# Patient Record
Sex: Male | Born: 1955
Health system: Southern US, Community
[De-identification: ages and names within clinical notes are randomized; demographics above are authoritative.]

## PROBLEM LIST (undated history)

## (undated) DIAGNOSIS — M722 Plantar fascial fibromatosis: Secondary | ICD-10-CM

## (undated) DIAGNOSIS — G4733 Obstructive sleep apnea (adult) (pediatric): Secondary | ICD-10-CM

## (undated) DIAGNOSIS — K859 Acute pancreatitis without necrosis or infection, unspecified: Secondary | ICD-10-CM

## (undated) DIAGNOSIS — K5790 Diverticulosis of intestine, part unspecified, without perforation or abscess without bleeding: Secondary | ICD-10-CM

## (undated) DIAGNOSIS — I10 Essential (primary) hypertension: Secondary | ICD-10-CM

## (undated) DIAGNOSIS — Z9989 Dependence on other enabling machines and devices: Secondary | ICD-10-CM

## (undated) DIAGNOSIS — Z9889 Other specified postprocedural states: Secondary | ICD-10-CM

## (undated) HISTORY — DX: Diverticulosis of intestine, part unspecified, without perforation or abscess without bleeding: K57.90

## (undated) HISTORY — DX: Plantar fascial fibromatosis: M72.2

## (undated) HISTORY — DX: Other specified postprocedural states: Z98.890

## (undated) HISTORY — DX: Acute pancreatitis without necrosis or infection, unspecified: K85.90

## (undated) HISTORY — DX: Essential (primary) hypertension: I10

---

## 1991-08-24 HISTORY — PX: LUMBAR DISC SURGERY: SHX700

## 2011-08-24 DIAGNOSIS — K859 Acute pancreatitis without necrosis or infection, unspecified: Secondary | ICD-10-CM

## 2011-08-24 HISTORY — DX: Acute pancreatitis without necrosis or infection, unspecified: K85.90

## 2011-08-24 HISTORY — PX: KNEE ARTHROSCOPY: SHX127

## 2014-10-29 DIAGNOSIS — G4700 Insomnia, unspecified: Secondary | ICD-10-CM | POA: Insufficient documentation

## 2014-10-29 DIAGNOSIS — G4730 Sleep apnea, unspecified: Secondary | ICD-10-CM | POA: Insufficient documentation

## 2014-10-29 DIAGNOSIS — I10 Essential (primary) hypertension: Secondary | ICD-10-CM | POA: Insufficient documentation

## 2015-09-10 DIAGNOSIS — M7550 Bursitis of unspecified shoulder: Secondary | ICD-10-CM | POA: Insufficient documentation

## 2016-12-09 ENCOUNTER — Encounter: Payer: Self-pay | Admitting: Podiatry

## 2016-12-09 ENCOUNTER — Ambulatory Visit (INDEPENDENT_AMBULATORY_CARE_PROVIDER_SITE_OTHER): Payer: BLUE CROSS/BLUE SHIELD

## 2016-12-09 ENCOUNTER — Ambulatory Visit (INDEPENDENT_AMBULATORY_CARE_PROVIDER_SITE_OTHER): Payer: BLUE CROSS/BLUE SHIELD | Admitting: Podiatry

## 2016-12-09 VITALS — BP 127/76 | HR 71 | Resp 16 | Ht 70.0 in | Wt 280.0 lb

## 2016-12-09 DIAGNOSIS — M722 Plantar fascial fibromatosis: Secondary | ICD-10-CM | POA: Diagnosis not present

## 2016-12-09 MED ORDER — TRIAMCINOLONE ACETONIDE 10 MG/ML IJ SUSP
10.0000 mg | Freq: Once | INTRAMUSCULAR | Status: AC
  Administered 2016-12-09: 10 mg

## 2016-12-09 NOTE — Patient Instructions (Signed)

## 2016-12-09 NOTE — Progress Notes (Signed)
Subjective:     Patient ID: Louis Watson, male   DOB: July 18, 1956, 61 y.o.   MRN: [id number]  HPI patient presents with an approximate 4 year history of heel pain left and is had orthotics over 82 years old that are no longer providing function and is had them worked on several times. Patient states the pain is been intense for approximately the last 3 months   Review of Systems  All other systems reviewed and are negative.      Objective:   Physical Exam  Constitutional: He is oriented to person, place, and time.  Cardiovascular: Intact distal pulses.   Musculoskeletal: Normal range of motion.  Neurological: He is oriented to person, place, and time.  Skin: Skin is warm.  Nursing note and vitals reviewed.  neurovascular status found to be intact with muscle strength adequate range of motion within normal limits with patient noted to have exquisite discomfort medial fascial band left heel at the insertional point tendon the calcaneus with moderate depression of the arch noted     Assessment:     Acute plantar fasciitis left with structural changes and arch changes consistent with long-term chronic plantar fasciitis    Plan:     H&P x-ray reviewed and today I injected the plantar fascial left 3 mg Kenalog 5 mill grams Xylocaine and applied fascial brace with instructions on usage along with instructions on physical therapy. I do think he'll need new orthotics and the existing pair can be rehabilitated and I have scheduled him with pad or just in 2 weeks to discuss a new orthotic which will be slightly softer with softer materials to wear and to have this pair redone. Patient will also see me in 2 weeks  X-ray report indicate spur formation both plantar and posterior left heel with depression of the arch noted

## 2016-12-09 NOTE — Progress Notes (Signed)
   Subjective:    Patient ID: Louis Watson, male    DOB: 20-Jan-1956, 61 y.o.   MRN: [id number]  HPI Chief Complaint  Patient presents with  . Foot Pain    Left foot; bottom of heel; pt stated, "Pain radiates up the foot; had flare-up 2 months ago"      Review of Systems  Musculoskeletal: Positive for gait problem.  Skin: Positive for rash.  All other systems reviewed and are negative.      Objective:   Physical Exam        Assessment & Plan:

## 2016-12-29 ENCOUNTER — Ambulatory Visit: Payer: BLUE CROSS/BLUE SHIELD | Admitting: *Deleted

## 2016-12-29 ENCOUNTER — Ambulatory Visit (INDEPENDENT_AMBULATORY_CARE_PROVIDER_SITE_OTHER): Payer: BLUE CROSS/BLUE SHIELD | Admitting: Podiatry

## 2016-12-29 DIAGNOSIS — M722 Plantar fascial fibromatosis: Secondary | ICD-10-CM

## 2016-12-29 NOTE — Progress Notes (Signed)
Patient presents today for fitting of CMFO...patient has cavus foot type and experiencing facia pain at insertion into navicular tuberosity.   FO recommended are semi-rigid shell w/ 4* valgus ff post. 1/8" PPT padding and EVA cover..same weighs 280 # and wears a size 10.5 shoe....  Patient will get current FO recovered after he receives in 3 weeks..  [id number] x 2

## 2016-12-29 NOTE — Progress Notes (Signed)
P 

## 2017-01-10 ENCOUNTER — Encounter: Payer: Self-pay | Admitting: *Deleted

## 2017-01-10 ENCOUNTER — Telehealth: Payer: Self-pay | Admitting: *Deleted

## 2017-01-10 NOTE — Telephone Encounter (Signed)
PreVisit Call completed. [place] [facility] was [facility]. Pt states that 1 year ago a cyst was found on one of his kidneys through an ultrasound. Pt does not believe follow up was required.  Pt also states that he has been experiencing right shoulder pain for 1.5 years after trying to swat a grasshopper. States it mostly hurts when he tries to reach for something or extend his arm in a certain way. He also cannot sleep on it without pain. Pt also being treated for plantar fascitis and gets new orthodics soon.  Pt states last colon[facility] was 3 months ago [facility]. [place] will also have last TDAP.

## 2017-01-11 ENCOUNTER — Ambulatory Visit (INDEPENDENT_AMBULATORY_CARE_PROVIDER_SITE_OTHER): Payer: BLUE CROSS/BLUE SHIELD | Admitting: Family Medicine

## 2017-01-11 ENCOUNTER — Ambulatory Visit (INDEPENDENT_AMBULATORY_CARE_PROVIDER_SITE_OTHER): Payer: BLUE CROSS/BLUE SHIELD

## 2017-01-11 ENCOUNTER — Encounter: Payer: Self-pay | Admitting: Family Medicine

## 2017-01-11 VITALS — BP 114/76 | HR 60 | Temp 97.9°F | Ht 70.0 in | Wt 274.4 lb

## 2017-01-11 DIAGNOSIS — G8929 Other chronic pain: Secondary | ICD-10-CM | POA: Diagnosis not present

## 2017-01-11 DIAGNOSIS — K5790 Diverticulosis of intestine, part unspecified, without perforation or abscess without bleeding: Secondary | ICD-10-CM | POA: Diagnosis not present

## 2017-01-11 DIAGNOSIS — G4700 Insomnia, unspecified: Secondary | ICD-10-CM | POA: Diagnosis not present

## 2017-01-11 DIAGNOSIS — M25511 Pain in right shoulder: Secondary | ICD-10-CM

## 2017-01-11 DIAGNOSIS — N529 Male erectile dysfunction, unspecified: Secondary | ICD-10-CM

## 2017-01-11 DIAGNOSIS — Z8719 Personal history of other diseases of the digestive system: Secondary | ICD-10-CM

## 2017-01-11 DIAGNOSIS — I10 Essential (primary) hypertension: Secondary | ICD-10-CM

## 2017-01-11 DIAGNOSIS — Z9889 Other specified postprocedural states: Secondary | ICD-10-CM

## 2017-01-11 MED ORDER — SILDENAFIL CITRATE 20 MG PO TABS: ORAL_TABLET | ORAL | 0 refills | Status: DC

## 2017-01-11 MED ORDER — AMLODIPINE BESYLATE 5 MG PO TABS: 5.0000 mg | ORAL_TABLET | Freq: Every day | ORAL | 3 refills | Status: DC

## 2017-01-11 MED ORDER — ESZOPICLONE 2 MG PO TABS: 2.0000 mg | ORAL_TABLET | Freq: Every evening | ORAL | 3 refills | Status: DC | PRN

## 2017-01-11 NOTE — Progress Notes (Signed)
Louis Watson is a 61 y.o. male [facility]   Patient Care Team: Louis Watson, Louis Gruen, DO as PCP - General (Family Medicine)   History of Present Illness:   Louis Watson CMA acting as scribe for Louis Watson.  HPI:  1. Shoulder Pain. Patient complains of right shoulder pain. The symptoms began several years ago. Aggravating factors: injury happened when he reached back while arm was abducted. Pain is located in the anterior glenohumeral region. Discomfort is described as aching and sharp/stabbing. Symptoms are exacerbated by repetitive movements, overhead movements and lying on the shoulder. Evaluation to date: none. Therapy to date includes: nothing specific.   2. Erectile dysfunction. Patient has been on Viagra prn for over a year now. Tolerates medications well, no side effects. Asks for a refill today.   3. Hypertension.  Home blood pressure readings not taken.  Avoiding excessive salt intake? [x]   YES  []   NO Trying to exercise on a regular basis? [x]   YES  []   NO Review: taking medications as instructed, no medication side effects noted, no TIAs, no chest pain on exertion, no dyspnea on exertion, no swelling of ankles.   Wt Readings from Last 3 Encounters:  01/11/17 274 lb 6.4 oz (124.5 kg)  12/09/16 280 lb (127 kg)   Reports that he has been smoking Cigars.  He has never used smokeless tobacco.  BP Readings from Last 3 Encounters:  01/11/17 114/76  12/09/16 127/76    4. Insomnia. Patient complains of frequent night time awakening and difficulty falling asleep for the past 1 year.  Associated symptoms include daytime somnolence. The patient has been taking: Lunesta. Side effects from the medication: none.    Health Maintenance Due  Topic Date Due  . Hepatitis C Screening  06/15/1956  . HIV Screening  04/14/1970  . TETANUS/TDAP  04/14/1974   PMHx, SurgHx, SocialHx, Medications, and Allergies were reviewed in the Visit Navigator and updated as appropriate.    Past Medical History:  Diagnosis Date  . Hypertension   . Pancreatitis 2013  . Plantar fasciitis     Past Surgical History:  Procedure Laterality Date  . KNEE ARTHROSCOPY  2013  . LUMBAR DISC SURGERY  1993    Family History  Problem Relation Age of Onset  . Congestive Heart Failure Mother    Social History  Substance Use Topics  . Smoking status: Current Some Day Smoker    Types: Cigars  . Smokeless tobacco: Never Used  . Alcohol use No    Current Medications and Allergies:   Current Outpatient Prescriptions:  .  amLODipine (NORVASC) 5 MG tablet, Take by mouth daily. , Disp: , Rfl:  .  aspirin EC 81 MG tablet, Take 81 mg by mouth daily., Disp: , Rfl:  .  eszopiclone (LUNESTA) 2 MG TABS tablet, Take 2 mg by mouth at bedtime as needed for sleep. Take immediately before bedtime, Disp: , Rfl:  .  Sildenafil Citrate (VIAGRA PO), Take by mouth as needed., Disp: , Rfl:   Allergies  Allergen Reactions  . Penicillins Rash   Review of Systems:   Review of Systems  Constitutional: Negative for chills, fever and malaise/fatigue.  HENT: Negative for ear pain, sinus pain and sore throat.   Eyes: Negative for blurred vision and double vision.  Respiratory: Negative for cough, shortness of breath and wheezing.   Cardiovascular: Negative for chest pain, palpitations and leg swelling.  Gastrointestinal: Negative for diarrhea, nausea and vomiting.  Musculoskeletal: Negative  for back pain, joint pain and neck pain.  Neurological: Negative for dizziness and headaches.  Psychiatric/Behavioral: Negative for depression, hallucinations and memory loss.   Vitals:   Vitals:   01/11/17 1100  BP: 114/76  Pulse: 60  Temp: 97.9 F (36.6 C)  TempSrc: Oral  SpO2: 97%  Weight: 274 lb 6.4 oz (124.5 kg)  Height: 5\' 10"  (1.778 m)     Body mass index is 39.37 kg/m.  Physical Exam:   Physical Exam  Constitutional: He is oriented to person, place, and time. He appears well-developed  and well-nourished. No distress.  HENT:  Head: Normocephalic and atraumatic.  Right Ear: External ear normal.  Left Ear: External ear normal.  Nose: Nose normal.  Mouth/Throat: Oropharynx is clear and moist.  Eyes: Conjunctivae and EOM are normal. Pupils are equal, round, and reactive to light.  Neck: Normal range of motion. Neck supple.  Cardiovascular: Normal rate, regular rhythm, normal heart sounds and intact distal pulses.   Pulmonary/Chest: Effort normal and breath sounds normal.  Abdominal: Soft. Bowel sounds are normal.  Musculoskeletal:       Right shoulder: He exhibits decreased range of motion, tenderness and decreased strength. He exhibits no bony tenderness, no swelling, no effusion, no crepitus, no deformity, no spasm and normal pulse.  Neurological: He is alert and oriented to person, place, and time.  Skin: Skin is warm and dry.  Psychiatric: He has a normal mood and affect. His behavior is normal. Judgment and thought content normal.  Nursing note and vitals reviewed.   Assessment and Plan:   [name] was seen today for establish care and shoulder pain.  Diagnoses and all orders for this visit:  Chronic right shoulder pain Comments: New. Imaging today reveals arthritis. To [name] for further evaluation. Orders: -     DG Shoulder Right  Erectile dysfunction, unspecified erectile dysfunction type Comments: Well controlled.  No signs of complications, medication side effects, or red flags.  Continue current regimen. Refill of Viagra today. Orders: -     sildenafil (REVATIO) 20 MG tablet; 5 po x 1, take 0.5 to 4 hours prior to sexual activity  Essential hypertension Comments: Well controlled.  No signs of complications, medication side effects, or red flags.  Continue current regimen. Refill of medications today. Orders: -     amLODipine (NORVASC) 5 MG tablet; Take 1 tablet (5 mg total) by mouth daily.  Insomnia, unspecified type Comments: Well controlled.  No  signs of complications, medication side effects, or red flags.  Continue current regimen. Refill of medications today, Orders: -     eszopiclone (LUNESTA) 2 MG TABS tablet; Take 1 tablet (2 mg total) by mouth at bedtime as needed for sleep. Take immediately before bedtime  Records requested if needed. Time spent with the patient: 30  minutes, of which >50% was spent in obtaining information about his symptoms, reviewing his previous labs, evaluations, and treatments, counseling him about his condition (please see the discussed topics above), and developing a plan to further investigate it; he had a number of questions which I addressed.  We reviewed HM issues, reviewed my guidelines re: controlled substances. I went over therapeutic exercises to try until his SM evaluation.  . Reviewed expectations re: course of current medical issues. . Discussed self-management of symptoms. . Outlined signs and symptoms indicating need for more acute intervention. . Patient verbalized understanding and all questions were answered. Marland Kitchen Health Maintenance issues including appropriate healthy diet, exercise, and smoking avoidance were discussed with  patient. . See orders for this visit as documented in the electronic medical record. . Patient received an After Visit Summary.  CMA served as Neurosurgeon during this visit. History, Physical, and Plan performed by medical provider. The above documentation has been reviewed and is accurate and complete. Louis Rima, D.O.  Louis Rima, DO [place], [place] 01/15/2017  Future Appointments Date Time Provider Department Watson  01/19/2017 4:00 PM Andrena Mews, DO [facility] None  01/20/2017 3:30 PM [facility]  07/07/2017 3:45 PM Louis Rima, DO [facility] None

## 2017-01-15 DIAGNOSIS — M25511 Pain in right shoulder: Principal | ICD-10-CM

## 2017-01-15 DIAGNOSIS — G8929 Other chronic pain: Secondary | ICD-10-CM | POA: Insufficient documentation

## 2017-01-15 DIAGNOSIS — N529 Male erectile dysfunction, unspecified: Secondary | ICD-10-CM | POA: Insufficient documentation

## 2017-01-17 ENCOUNTER — Encounter: Payer: Self-pay | Admitting: Family Medicine

## 2017-01-17 DIAGNOSIS — Z9889 Other specified postprocedural states: Secondary | ICD-10-CM | POA: Insufficient documentation

## 2017-01-17 DIAGNOSIS — K5790 Diverticulosis of intestine, part unspecified, without perforation or abscess without bleeding: Secondary | ICD-10-CM | POA: Insufficient documentation

## 2017-01-17 DIAGNOSIS — Z8719 Personal history of other diseases of the digestive system: Secondary | ICD-10-CM | POA: Insufficient documentation

## 2017-01-17 HISTORY — DX: Other specified postprocedural states: Z98.890

## 2017-01-17 HISTORY — DX: Diverticulosis of intestine, part unspecified, without perforation or abscess without bleeding: K57.90

## 2017-01-19 ENCOUNTER — Ambulatory Visit (INDEPENDENT_AMBULATORY_CARE_PROVIDER_SITE_OTHER): Payer: BLUE CROSS/BLUE SHIELD | Admitting: Sports Medicine

## 2017-01-19 ENCOUNTER — Ambulatory Visit: Payer: Self-pay

## 2017-01-19 ENCOUNTER — Encounter: Payer: Self-pay | Admitting: Sports Medicine

## 2017-01-19 VITALS — BP 122/82 | HR 65 | Ht 70.0 in | Wt 277.2 lb

## 2017-01-19 DIAGNOSIS — S4381XA Sprain of other specified parts of right shoulder girdle, initial encounter: Secondary | ICD-10-CM

## 2017-01-19 DIAGNOSIS — M25511 Pain in right shoulder: Secondary | ICD-10-CM | POA: Diagnosis not present

## 2017-01-19 DIAGNOSIS — G8929 Other chronic pain: Secondary | ICD-10-CM

## 2017-01-19 DIAGNOSIS — S46811A Strain of other muscles, fascia and tendons at shoulder and upper arm level, right arm, initial encounter: Secondary | ICD-10-CM

## 2017-01-19 MED ORDER — NITROGLYCERIN 0.2 MG/HR TD PT24: MEDICATED_PATCH | TRANSDERMAL | 1 refills | Status: DC

## 2017-01-19 NOTE — Progress Notes (Signed)
OFFICE VISIT NOTE Louis Watson. Louis Watson Sports [facility] [phone]  Louis Watson - 61 y.o. male MRN [id number]  Date of birth: 1956/08/02  Visit Date: 01/19/2017  PCP: Helane Rima, DO   Referred by: Helane Rima, DO  Orlie Dakin, CMA acting as scribe for Dr. [name].  SUBJECTIVE:   Chief Complaint  Patient presents with  . pain in right shoulder   HPI: As below and per problem based documentation when appropriate.  Pt presents today with right shoulder pain, anterior glenohumeral region.  Pain has been present for the past year and a half but has gotten worse over the past several months.  Pain started while pt was sitting with his arms on the railing when he swung his arms backward while the arm was abducted.   The pain is described as constant dull aching pain with an occasional sharp stabbing pain with certain movements. Pain is rated as 1/10 on average but 10/10 with certain movements.  Worsened with repetitive movements, overhead movements, and lying on the right shoulder.  Improves with resting the arm but pain never completely resolves Therapies tried include : Pt has tried Ibuprofen with some relief, he uses this mostly for his plantar fasciitis. He has also tried home exercises but doesn't do them often because the sharp pain doesn't occur often.   Other associated symptoms include: pain radiates down the right arm but doesn't go below the elbow.   Pt had xray done 01/11/17  Pt denies fever, chills, night sweats, unintentional weight loss or weight gain.    Review of Systems  Constitutional: Negative for chills and fever.  Respiratory: Negative for shortness of breath and wheezing.   Cardiovascular: Negative for chest pain, palpitations and leg swelling.  Musculoskeletal: Positive for joint pain. Negative for falls.  Neurological: Negative for dizziness, tingling and headaches.  Endo/Heme/Allergies: Does not  bruise/bleed easily.    Otherwise per HPI.  HISTORY & PERTINENT PRIOR DATA:  No specialty comments available. He reports that he has been smoking Cigars.  He has never used smokeless tobacco. No results for input(s): HGBA1C, LABURIC in the last 8760 hours. Medications & Allergies reviewed per EMR Patient Active Problem List   Diagnosis Date Noted  . Partial tear of right subscapularis tendon 01/25/2017  . Hx of pancreatitis 01/17/2017  . Hx of lumbar discectomy 01/17/2017  . Diverticulosis 01/17/2017  . Erectile dysfunction 01/15/2017  . Chronic right shoulder pain 01/15/2017  . Bursitis of shoulder 09/10/2015  . Hypertension 10/29/2014  . Insomnia 10/29/2014  . Sleep apnea 10/29/2014   Past Medical History:  Diagnosis Date  . Diverticulosis 01/17/2017  . Hx of lumbar discectomy 01/17/2017  . Hx of pancreatitis 01/17/2017  . Hypertension   . Pancreatitis 2013  . Plantar fasciitis    Family History  Problem Relation Age of Onset  . Congestive Heart Failure Mother    Past Surgical History:  Procedure Laterality Date  . KNEE ARTHROSCOPY  2013  . LUMBAR DISC SURGERY  1993   Social History   Occupational History  . Not on file.   Social History Main Topics  . Smoking status: Current Some Day Smoker    Types: Cigars  . Smokeless tobacco: Never Used  . Alcohol use No  . Drug use: No  . Sexual activity: Not on file    OBJECTIVE:  VS:  HT:5\' 10"  (177.8 cm)   WT:277 lb 3.2 oz (125.7 kg)  BMI:39.9  BP:122/82  HR:65bpm  TEMP: ( )  RESP:95 % EXAM: Findings:  WDWN, NAD, Non-toxic appearing Alert & appropriately interactive Not depressed or anxious appearing No increased work of breathing. Pupils are equal. EOM intact without nystagmus No clubbing or cyanosis of the extremities appreciated No significant rashes/lesions/ulcerations overlying the examined area. Radial pulses 2+/4.  No significant generalized UE edema. Sensation intact to light touch in upper  extremities.  Right Shoulder Exam: Normal alignment, Normal Contours No overlying erythema/ecchymosis. No pain or. Crepitation with axial loading and circumduction TTP over: Anterior deltopectoral groove as well as bicipital groove. No TTP over: AC joint, clavicle or posterior shoulder Internal Rotation: Range of motion is normal however marked pain with resisted manual muscle testing. External Rotation: Limited by 25 Empty can: Mildly painful strength is intact Hawkins: Mildly painful Neers: Painful Speeds: Only minimal pain O'Brien's: Only mild discomfort   ++++++++++++++++++++++++++++++++++++++++++++++++++++++++++++++++++ LIMITED MSK ULTRASOUND OF right shoulder Images were obtained and interpreted by myself, Gaspar Bidding, DO  Images have been saved and stored to PACS system. Images obtained on: GE S7 Ultrasound machine  FINDINGS:  Biceps Tendon: Slight subluxation of the bicipital groove but the tendon is intact. Pec Major Insertion: Normal Subscapularis Tendon: There is marked thickening and hypoechoic change within the superior fibers with atrophy of the muscle.  Unable to appreciate any full-thickness component Supraspinatus Tendon: Appears to be intact without significant subacromial impingement Infraspinatus/Teres Minor Tendon: Normal AC Joint: Small ultrasound that is nonpainful to sono palpation JOINT: Only minimal degenerative change of the glenohumeral joint LABRUM: Could not appreciate on exam  IMPRESSION:  Subscapularis tendon interstitial tear without ultrasound evidence of full-thickness involvement     Dg Shoulder Right  Result Date: 01/11/2017 CLINICAL DATA:  Right shoulder pain.  No injury. EXAM: RIGHT SHOULDER - 2+ VIEW COMPARISON:  None. FINDINGS: Acromioclavicular glenohumeral degenerative change. No evidence of fracture dislocation. Small soft tissue calcification noted over the proximal humerus of questionable etiology, most likely benign  dystrophic calcification. IMPRESSION: Acromioclavicular and glenohumeral degenerative change. No acute abnormality. Electronically Signed   By: Maisie Fus  Register   On: 01/11/2017 15:36   ASSESSMENT & PLAN:   Problem List Items Addressed This Visit    Chronic right shoulder pain - Primary    The [facility] joint arthropathy does not seem to be contributing to his pain as he has no focal tenderness over the [facility] joint.  This can be done in the future if he wishes.       Relevant Orders   [place] LIMITED JOINT SPACE STRUCTURES UP RIGHT(NO LINKED CHARGES)   Partial tear of right subscapularis tendon    Does have findings suggesting a subscapularis tendon tear superior fibers with likely biceps subluxation. We discussed multiple options for this including conservative measures with scapular stabilizing exercises rotator cuff strengthening especially focusing on eccentric phase and biologic therapy with nitroglycerin.  Patient would like to pursue this route and we will plan to reevaluate him in 6 weeks.    +++++++++++++++++++++++++++++++++++++++++++++++++++++++++++++++ PROCEDURE NOTE: THERAPEUTIC EXERCISES (97110) 15 minutes spent for Therapeutic exercises as stated in above notes.  This included exercises focusing on stretching, strengthening, with significant focus on eccentric aspects.   Proper technique shown and discussed handout in great detail with ATC.  All questions were discussed and answered.        Relevant Medications   nitroGLYCERIN (NITRODUR - DOSED IN MG/24 HR) 0.2 mg/hr patch      Follow-up: Return in about 6 weeks (around 03/02/2017).   CMA/ATC  served as Neurosurgeonscribe during this visit. History, Physical, and Plan performed by medical provider. Documentation and orders reviewed and attested to.      Gaspar BiddingMichael Rigby, DO    [facility] Physician

## 2017-01-19 NOTE — Patient Instructions (Addendum)
Please perform the exercise program that Fayrene FearingJames has prepared for you and gone over in detail on a daily basis.  In addition to the handout you were provided you can access your program through: [email or web address]   Your unique program code is: [email or web address]   Nitroglycerin Protocol   Apply 1/4 nitroglycerin patch to affected area daily.  Change position of patch within the affected area every 24 hours.  You may experience a headache during the first 1-2 weeks of using the patch, these should subside.  If you experience headaches after beginning nitroglycerin patch treatment, you may take your preferred over the counter pain reliever.  Another side effect of the nitroglycerin patch is skin irritation or rash related to patch adhesive.  Please notify our office if you develop more severe headaches or rash, and stop the patch.  Tendon healing with nitroglycerin patch may require 12 to 24 weeks depending on the extent of injury.  Men should not use if taking Viagra, Cialis, or Levitra.   Do not use if you have migraines or rosacea.

## 2017-01-20 ENCOUNTER — Other Ambulatory Visit: Payer: BLUE CROSS/BLUE SHIELD

## 2017-01-20 DIAGNOSIS — M722 Plantar fascial fibromatosis: Secondary | ICD-10-CM

## 2017-01-25 DIAGNOSIS — S46811A Strain of other muscles, fascia and tendons at shoulder and upper arm level, right arm, initial encounter: Secondary | ICD-10-CM | POA: Insufficient documentation

## 2017-01-25 DIAGNOSIS — S4381XA Sprain of other specified parts of right shoulder girdle, initial encounter: Secondary | ICD-10-CM | POA: Insufficient documentation

## 2017-01-25 NOTE — Assessment & Plan Note (Signed)
[facility] does not seem to be contributing to his pain as he has no focal tenderness [facility].  This can be done in the future if he wishes.

## 2017-01-25 NOTE — Assessment & Plan Note (Signed)
Does have findings suggesting a subscapularis tendon tear superior fibers with likely biceps subluxation. We discussed multiple options for this including conservative measures with scapular stabilizing exercises rotator cuff strengthening especially focusing on eccentric phase and biologic therapy with nitroglycerin.  Patient would like to pursue this route and we will plan to reevaluate him in 6 weeks.    +++++++++++++++++++++++++++++++++++++++++++++++++++++++++++++++ PROCEDURE NOTE: THERAPEUTIC EXERCISES (97110) 15 minutes spent for Therapeutic exercises as stated in above notes.  This included exercises focusing on stretching, strengthening, with significant focus on eccentric aspects.   Proper technique shown and discussed handout in great detail with ATC.  All questions were discussed and answered.

## 2017-03-02 ENCOUNTER — Ambulatory Visit: Payer: BLUE CROSS/BLUE SHIELD | Admitting: Sports Medicine

## 2017-06-01 ENCOUNTER — Telehealth: Payer: Self-pay | Admitting: Family Medicine

## 2017-06-01 DIAGNOSIS — G4700 Insomnia, unspecified: Secondary | ICD-10-CM

## 2017-06-01 MED ORDER — ESZOPICLONE 2 MG PO TABS: 2.0000 mg | ORAL_TABLET | Freq: Every evening | ORAL | 3 refills | Status: DC | PRN

## 2017-06-01 NOTE — Telephone Encounter (Signed)
One refill. Make sur he had follow up.

## 2017-06-01 NOTE — Telephone Encounter (Signed)
RX faxed to pharmacy.

## 2017-06-01 NOTE — Telephone Encounter (Signed)
MEDICATION: eszopiclone (LUNESTA) 2 MG TABS tablet  PHARMACY:   [id number] - [place], [place] - [address] [phone] (Phone) [phone] (Fax)     IS THIS A 90 DAY SUPPLY : N  IS PATIENT OUT OF MEDICATION: N  IF NOT; HOW MUCH IS LEFT: 6 pills  LAST APPOINTMENT DATE: 01/11/17  NEXT APPOINTMENT DATE: 07/07/2017  OTHER COMMENTS: Patient stated he has eczema and would like to see a dermatologist for this. Patient would like to know if he needs to be seen in order to get a referral. Patient also stated he may need to see an optometrist. Patient stated his LFT eye is blurry/foggy for the first hour or so after waking up. Please call patient and advise. OK to leave message.    **Let patient know to contact pharmacy at the end of the day to make sure medication is ready. **  ** Please notify patient to allow 48-72 hours to process**  **Encourage patient to contact the pharmacy for refills or they can request refills through [facility]**

## 2017-06-01 NOTE — Telephone Encounter (Signed)
Please advise on refill.

## 2017-06-14 ENCOUNTER — Ambulatory Visit: Payer: BLUE CROSS/BLUE SHIELD | Admitting: Family Medicine

## 2017-06-17 ENCOUNTER — Ambulatory Visit (INDEPENDENT_AMBULATORY_CARE_PROVIDER_SITE_OTHER): Payer: BLUE CROSS/BLUE SHIELD | Admitting: Family Medicine

## 2017-06-17 ENCOUNTER — Encounter: Payer: Self-pay | Admitting: Family Medicine

## 2017-06-17 VITALS — BP 130/80 | HR 68 | Ht 70.0 in | Wt 280.8 lb

## 2017-06-17 DIAGNOSIS — L570 Actinic keratosis: Secondary | ICD-10-CM

## 2017-06-17 DIAGNOSIS — L309 Dermatitis, unspecified: Secondary | ICD-10-CM

## 2017-06-17 DIAGNOSIS — H527 Unspecified disorder of refraction: Secondary | ICD-10-CM

## 2017-06-17 MED ORDER — PREDNISONE 10 MG (21) PO TBPK: ORAL_TABLET | ORAL | 0 refills | Status: DC

## 2017-06-17 NOTE — Progress Notes (Signed)
    Subjective:  Louis Watson is a 61 y.o. male who presents today with a chief complaint of blurred vision   HPI:  Blurred Vision, Acute Issue Symptoms started 2-3 months ago.  No obvious precipitating events.  Blurriness is worse in the morning and gradually improves throughout the day.  No history of trauma.  He had his last eye exam about 4-5 years ago.  Wears corrective lenses.  No eye pain.  No discharge.  Has a family history of cataracts and macular degeneration his mother.  Eczema, Chronic Problem, New to this provider Several year history.  Has seen dermatology in the past for actinic keratoses on his head.  He has been using betamethasone cream on his body which helps some however lesion is now located over his entire body including extremities, trunk, and head.  No fevers.  No clear reason as to why his symptoms are worsening.  No other treatments tried.  ROS: Per HPI  PMH: Smoking history reviewed. Current smoker.   Objective:  Physical Exam: BP 130/80   Pulse 68   Ht 5\' 10"  (1.778 m)   Wt 280 lb 12.8 oz (127.4 kg)   SpO2 98%   BMI 40.29 kg/m   Gen: NAD, resting comfortably HEENT: Extraocular eye movements intact bilaterally.  No obvious deformities or cataracts. Skin: Diffuse erythematous, flaky, dry rash located on trunk, hands, and face.  Superficial excoriations noted.  No surrounding erythema or discharge.   Assessment/Plan:  Left eye blurriness Likely refractive error.  No signs of trauma, glaucoma, or infection.  Advised patient to see optometrist for eye exam soon.  Eczema Given widespread and severe nature will give prednisone taper.  Also recommended that patient start over-the-counter antihistamine such as cetirizine to help with the itching.  Continue betamethasone.  Referral to dermatology placed.  Actinic keratosis Noted on patient's scalp today.  He will be following up with dermatology.  Katina Degreealeb M. Jimmey RalphParker, MD 06/17/2017 4:17 PM

## 2017-06-17 NOTE — Patient Instructions (Signed)
Start the prednisone.  Try the cetirizine.  Call your insurance for an eye doctor.  I will send in a referral to dermatology.  Take care,  Dr Jimmey RalphParker

## 2017-07-05 DIAGNOSIS — X32XXXD Exposure to sunlight, subsequent encounter: Secondary | ICD-10-CM | POA: Diagnosis not present

## 2017-07-05 DIAGNOSIS — L308 Other specified dermatitis: Secondary | ICD-10-CM | POA: Diagnosis not present

## 2017-07-05 DIAGNOSIS — L570 Actinic keratosis: Secondary | ICD-10-CM | POA: Diagnosis not present

## 2017-07-07 ENCOUNTER — Encounter: Payer: BLUE CROSS/BLUE SHIELD | Admitting: Family Medicine

## 2017-07-07 ENCOUNTER — Other Ambulatory Visit: Payer: Self-pay | Admitting: Surgical

## 2017-07-07 ENCOUNTER — Other Ambulatory Visit (INDEPENDENT_AMBULATORY_CARE_PROVIDER_SITE_OTHER): Payer: BLUE CROSS/BLUE SHIELD

## 2017-07-07 ENCOUNTER — Ambulatory Visit: Payer: BLUE CROSS/BLUE SHIELD | Admitting: Family Medicine

## 2017-07-07 DIAGNOSIS — Z0000 Encounter for general adult medical examination without abnormal findings: Secondary | ICD-10-CM | POA: Diagnosis not present

## 2017-07-07 DIAGNOSIS — Z13220 Encounter for screening for lipoid disorders: Secondary | ICD-10-CM

## 2017-07-07 DIAGNOSIS — R5383 Other fatigue: Secondary | ICD-10-CM | POA: Diagnosis not present

## 2017-07-07 LAB — COMPREHENSIVE METABOLIC PANEL
ALT: 26 U/L (ref 0–53)
AST: 22 U/L (ref 0–37)
Albumin: 4.1 g/dL (ref 3.5–5.2)
Alkaline Phosphatase: 67 U/L (ref 39–117)
BUN: 24 mg/dL — ABNORMAL HIGH (ref 6–23)
CO2: 27 mEq/L (ref 19–32)
Calcium: 9.3 mg/dL (ref 8.4–10.5)
Chloride: 103 mEq/L (ref 96–112)
Creatinine, Ser: 1.1 mg/dL (ref 0.40–1.50)
GFR: 72.04 mL/min (ref >60.00)
Glucose, Bld: 129 mg/dL — ABNORMAL HIGH (ref 70–99)
Potassium: 4.5 mEq/L (ref 3.5–5.1)
Sodium: 140 mEq/L (ref 135–145)
Total Bilirubin: 1 mg/dL (ref 0.2–1.2)
Total Protein: 6.8 g/dL (ref 6.0–8.3)

## 2017-07-07 LAB — CBC WITH DIFFERENTIAL/PLATELET
Basophils Absolute: 0 10*3/uL (ref 0.0–0.1)
Basophils Relative: 0.2 % (ref 0.0–3.0)
Eosinophils Absolute: 0 10*3/uL (ref 0.0–0.7)
Eosinophils Relative: 0 % (ref 0.0–5.0)
HCT: 47.3 % (ref 39.0–52.0)
Hemoglobin: 15.8 g/dL (ref 13.0–17.0)
Lymphocytes Relative: 12 % (ref 12.0–46.0)
Lymphs Abs: 1.4 10*3/uL (ref 0.7–4.0)
MCHC: 33.5 g/dL (ref 30.0–36.0)
MCV: 99.6 fl (ref 78.0–100.0)
Monocytes Absolute: 0.4 10*3/uL (ref 0.1–1.0)
Monocytes Relative: 3.8 % (ref 3.0–12.0)
Neutro Abs: 9.7 10*3/uL — ABNORMAL HIGH (ref 1.4–7.7)
Neutrophils Relative %: 84 % — ABNORMAL HIGH (ref 43.0–77.0)
Platelets: 267 10*3/uL (ref 150.0–400.0)
RBC: 4.75 Mil/uL (ref 4.22–5.81)
RDW: 12.4 % (ref 11.5–15.5)
WBC: 11.6 10*3/uL — ABNORMAL HIGH (ref 4.0–10.5)

## 2017-07-07 LAB — LIPID PANEL
Cholesterol: 200 mg/dL (ref 0–200)
HDL: 56 mg/dL (ref >39.00)
LDL Cholesterol: 133 mg/dL — ABNORMAL HIGH (ref 0–99)
NonHDL: 143.92
Total CHOL/HDL Ratio: 4
Triglycerides: 56 mg/dL (ref 0.0–149.0)
VLDL: 11.2 mg/dL (ref 0.0–40.0)

## 2017-07-26 ENCOUNTER — Encounter: Payer: Self-pay | Admitting: Family Medicine

## 2017-07-26 ENCOUNTER — Ambulatory Visit (INDEPENDENT_AMBULATORY_CARE_PROVIDER_SITE_OTHER): Payer: BLUE CROSS/BLUE SHIELD | Admitting: Family Medicine

## 2017-07-26 VITALS — BP 130/76 | HR 70 | Temp 98.5°F | Ht 70.0 in | Wt 276.2 lb

## 2017-07-26 DIAGNOSIS — Z0000 Encounter for general adult medical examination without abnormal findings: Secondary | ICD-10-CM

## 2017-07-26 DIAGNOSIS — I10 Essential (primary) hypertension: Secondary | ICD-10-CM | POA: Diagnosis not present

## 2017-07-26 DIAGNOSIS — Z114 Encounter for screening for human immunodeficiency virus [HIV]: Secondary | ICD-10-CM

## 2017-07-26 DIAGNOSIS — R7303 Prediabetes: Secondary | ICD-10-CM | POA: Diagnosis not present

## 2017-07-26 DIAGNOSIS — N529 Male erectile dysfunction, unspecified: Secondary | ICD-10-CM | POA: Diagnosis not present

## 2017-07-26 DIAGNOSIS — Z1159 Encounter for screening for other viral diseases: Secondary | ICD-10-CM | POA: Diagnosis not present

## 2017-07-26 LAB — POCT GLYCOSYLATED HEMOGLOBIN (HGB A1C): Hemoglobin A1C: 5.9

## 2017-07-26 MED ORDER — METFORMIN HCL 500 MG PO TABS: 500.0000 mg | ORAL_TABLET | Freq: Two times a day (BID) | ORAL | 0 refills | Status: DC

## 2017-07-26 MED ORDER — SILDENAFIL CITRATE 20 MG PO TABS: ORAL_TABLET | ORAL | 0 refills | Status: DC

## 2017-07-26 NOTE — Progress Notes (Signed)
Subjective:    Louis Watson is a 61 y.o. male who presents today for his Complete Annual Exam. He feels well. He reports that he does not exercise regularly.Marland Kitchen. He reports he is sleeping well when he uses his CPAP and takes [place].   Health Maintenance Due  Topic Date Due  . Hepatitis C Screening  [birth year]  . HIV Screening  04/14/1970  . TETANUS/TDAP  04/14/1974   Prostate Symptoms Questionnaire: 1. Have you had the sensation of not emptying your bladder completely after you finished urinating? No. 2. Have you had to urinate again less than two hours after you finished urinating? No. 3. Have you found you stopped and started again several times when you urinated? No. 4. Have you found it difficult to postpone urination? No. 5. Have you had a weak urinary stream? Yes.   6. Have you had to push or strain to begin urination? No. 7. How many times did you most typically get up to urinate from the time you went to bed at night until the time you got up in the morning? 1-2  PMHx, SurgHx, SocialHx, Medications, and Allergies were reviewed in the Visit Navigator and updated as appropriate.   Past Medical History:  Diagnosis Date  . Diverticulosis 01/17/2017  . Hx of lumbar discectomy 01/17/2017  . Hx of pancreatitis 01/17/2017  . Hypertension   . Pancreatitis 2013  . Plantar fasciitis     Past Surgical History:  Procedure Laterality Date  . KNEE ARTHROSCOPY  2013  . LUMBAR DISC SURGERY  1993    Family History  Problem Relation Age of Onset  . Congestive Heart Failure Mother    Social History   Tobacco Use  . Smoking status: Current Some Day Smoker    Types: Cigars  . Smokeless tobacco: Never Used  Substance Use Topics  . Alcohol use: No  . Drug use: No    Review of Systems:   Pertinent items are noted in the HPI. Otherwise, ROS is negative.  Objective:    Vitals:   07/26/17 1514  BP: 130/76  Pulse: 70  Temp: 98.5 F (36.9 C)   Body mass index is 39.63  kg/m.  General  Alert, cooperative, no distress, appears stated age  Head:  Normocephalic, without obvious abnormality, atraumatic  Eyes:  PERRL, conjunctiva/corneas clear, EOM's intact, fundi benign, both eyes       Ears:  Normal TM's and external ear canals, both ears  Nose: Nares normal, septum midline, mucosa normal, no drainage or sinus tenderness  Throat: Lips, mucosa, and tongue normal; teeth and gums normal  Neck: Supple, symmetrical, trachea midline, no adenopathy; thyroid: no enlargement/tenderness/nodules; no carotid bruit or JVD  Back:   Symmetric, no curvature, ROM normal, no CVA tenderness  Lungs:   Clear to auscultation bilaterally, respirations unlabored  Chest Wall:  No tenderness or deformity  Heart:  Regular rate and rhythm, S1 and S2 normal, no murmur, rub or gallop  Abdomen:   Soft, non-tender, bowel sounds active all four quadrants, no masses, no organomegaly  Extremities: Extremities normal, atraumatic, no cyanosis or edema  Prostate : Not done   Skin: Skin color, texture, turgor normal, no rashes or lesions  Lymph: Cervical, supraclavicular, and axillary nodes normal  Neurologic: CNII-XII grossly intact. Normal strength, sensation and reflexes throughout    Results for orders placed or performed in visit on 07/07/17  Lipid panel  Result Value Ref Range   Cholesterol 200 0 - 200 mg/dL  Triglycerides 56.0 0.0 - 149.0 mg/dL   HDL 16.10 >96.04 mg/dL   VLDL 54.0 0.0 - 98.1 mg/dL   LDL Cholesterol 191 (H) 0 - 99 mg/dL   Total CHOL/HDL Ratio 4    NonHDL 143.92   Comprehensive metabolic panel  Result Value Ref Range   Sodium 140 135 - 145 mEq/L   Potassium 4.5 3.5 - 5.1 mEq/L   Chloride 103 96 - 112 mEq/L   CO2 27 19 - 32 mEq/L   Glucose, Bld 129 (H) 70 - 99 mg/dL   BUN 24 (H) 6 - 23 mg/dL   Creatinine, Ser 4.78 0.40 - 1.50 mg/dL   Total Bilirubin 1.0 0.2 - 1.2 mg/dL   Alkaline Phosphatase 67 39 - 117 U/L   AST 22 0 - 37 U/L   ALT 26 0 - 53 U/L   Total  Protein 6.8 6.0 - 8.3 g/dL   Albumin 4.1 3.5 - 5.2 g/dL   Calcium 9.3 8.4 - 29.5 mg/dL   GFR 62.13 >08.65 mL/min  CBC with Differential/Platelet  Result Value Ref Range   WBC 11.6 (H) 4.0 - 10.5 K/uL   RBC 4.75 4.22 - 5.81 Mil/uL   Hemoglobin 15.8 13.0 - 17.0 g/dL   HCT 78.4 69.6 - 29.5 %   MCV 99.6 78.0 - 100.0 fl   MCHC 33.5 30.0 - 36.0 g/dL   RDW 28.4 13.2 - 44.0 %   Platelets 267.0 150.0 - 400.0 K/uL   Neutrophils Relative % 84.0 (H) 43.0 - 77.0 %   Lymphocytes Relative 12.0 12.0 - 46.0 %   Monocytes Relative 3.8 3.0 - 12.0 %   Eosinophils Relative 0.0 0.0 - 5.0 %   Basophils Relative 0.2 0.0 - 3.0 %   Neutro Abs 9.7 (H) 1.4 - 7.7 K/uL   Lymphs Abs 1.4 0.7 - 4.0 K/uL   Monocytes Absolute 0.4 0.1 - 1.0 K/uL   Eosinophils Absolute 0.0 0.0 - 0.7 K/uL   Basophils Absolute 0.0 0.0 - 0.1 K/uL   EKG: unchanged from previous tracings.   AssessmentPlan:   Louis Watson was seen today for annual exam.  Diagnoses and all orders for this visit:  Essential hypertension -     EKG 12-Lead  Pre-diabetes Comments:  Lab Results  Component Value Date   HGBA1C 5.9 07/26/2017    Lab Results  Component Value Date   CHOL 200 07/07/2017   HDL 56.00 07/07/2017   LDLCALC 133 (H) 07/07/2017   TRIG 56.0 07/07/2017   CHOLHDL 4 07/07/2017     Wt Readings from Last 3 Encounters:  07/26/17 276 lb 3.2 oz (125.3 kg)  06/17/17 280 lb 12.8 oz (127.4 kg)  01/19/17 277 lb 3.2 oz (125.7 kg)   BP Readings from Last 3 Encounters:  07/26/17 130/76  06/17/17 130/80  01/19/17 122/82   Lab Results  Component Value Date   CREATININE 1.10 07/07/2017   After discussion, patient would like to start below medication. Expectations, risks, and potential side effects reviewed. Recheck in 3 months.  Orders: -     POCT glycosylated hemoglobin (Hb A1C) -     metFORMIN (GLUCOPHAGE) 500 MG tablet; Take 1 tablet (500 mg total) by mouth 2 (two) times daily with a meal.  Need for hepatitis C screening test -      Hepatitis C Antibody  Erectile dysfunction, unspecified erectile dysfunction type Comments: Viagra is not helping the way that it did previously. Refill of Viagra today as patient requests it today. Urology referral.  Orders: -     sildenafil (REVATIO) 20 MG tablet; 5 po x 1, take 0.5 to 4 hours prior to sexual activity  Routine physical examination  The 10-year ASCVD risk score Denman George(Goff DC Jr., et al., 2013) is: 16.7%   Values used to calculate the score:     Age: [age] years     Sex: Male     Is Non-Hispanic African American: No     Diabetic: No     Tobacco smoker: Yes     Systolic Blood Pressure: 130 mmHg     Is BP treated: Yes     HDL Cholesterol: 56 mg/dL     Total Cholesterol: 200 mg/dL  Screening for HIV (human immunodeficiency virus) -     HIV antibody   Patient Counseling: [x]   Nutrition: Stressed importance of moderation in sodium/caffeine intake, saturated fat and cholesterol, caloric balance, sufficient intake of fresh fruits, vegetables, and fiber  [x]   Stressed the importance of regular exercise.   []   Substance Abuse: Discussed cessation/primary prevention of tobacco, alcohol, or other drug use; driving or other dangerous activities under the influence; availability of treatment for abuse.   [x]   Injury prevention: Discussed safety belts, safety helmets, smoke detector, smoking near bedding or upholstery.   []   Sexuality: Discussed sexually transmitted diseases, partner selection, use of condoms, avoidance of unintended pregnancy  and contraceptive alternatives.   [x]   Dental health: Discussed importance of regular tooth brushing, flossing, and dental visits.  [x]   Health maintenance and immunizations reviewed. Please refer to Health maintenance section.    Helane RimaErica Sotero Brinkmeyer, DO [place] [facility]

## 2017-07-27 LAB — HIV ANTIBODY (ROUTINE TESTING W REFLEX): HIV 1&2 Ab, 4th Generation: NONREACTIVE

## 2017-07-27 LAB — HEPATITIS C ANTIBODY
Hepatitis C Ab: NONREACTIVE
SIGNAL TO CUT-OFF: 0.01 (ref <1.00)

## 2017-08-08 DIAGNOSIS — N5201 Erectile dysfunction due to arterial insufficiency: Secondary | ICD-10-CM | POA: Diagnosis not present

## 2017-08-18 ENCOUNTER — Telehealth: Payer: Self-pay | Admitting: Family Medicine

## 2017-08-18 DIAGNOSIS — G4733 Obstructive sleep apnea (adult) (pediatric): Secondary | ICD-10-CM

## 2017-08-18 NOTE — Telephone Encounter (Signed)
Please see telephone note below and advise.

## 2017-08-18 NOTE — Telephone Encounter (Signed)
Copied from CRM [phone]. Topic: Quick Communication - See Telephone Encounter >> Aug 18, 2017  4:03 PM Terisa Starraylor, Brittany L wrote: CRM for notification. See Telephone encounter for:   08/18/17. Pt is requesting supplies for his Cpap machine. He said he thinks he needs a script for this. Call back is [phone]

## 2017-08-19 NOTE — Telephone Encounter (Signed)
Do I need to put order in or [facility]

## 2017-08-20 NOTE — Telephone Encounter (Signed)
I put the order in. Inform Mellody DanceKeith. Help me to clarify the protocol. I believe that we are not doing this efficiently.

## 2017-08-22 NOTE — Telephone Encounter (Signed)
Do you have this order?  

## 2017-08-24 NOTE — Telephone Encounter (Signed)
Order is placed as of 08/20/17, once worked will update status.

## 2017-09-02 ENCOUNTER — Ambulatory Visit: Payer: Self-pay | Admitting: *Deleted

## 2017-09-02 NOTE — Telephone Encounter (Signed)
Patient is calling to report some strange sensations he is having in his eyes when he is working. He has noticed an increase in frequency of symptoms. He states he feels like he is "drunk" or "spinning"- but does not have dizziness with this. He uses a CPAP machine and reports he keeps it clean.  Reason for Disposition . [1] MILD dizziness (e.g., vertigo; walking normally) AND [2] has been evaluated by physician for this  Answer Assessment - Initial Assessment Questions 1. DESCRIPTION: "Describe your dizziness."     3-4 months ago patient started feeling spinning sensations with the turning of his head.  2. VERTIGO: "Do you feel like either you or the room is spinning or tilting?"      No- hard to describe almost like a twisting sensation 3. LIGHTHEADED: "Do you feel lightheaded?" (e.g., somewhat faint, woozy, weak upon standing)     Woozy- not losing balance 4. SEVERITY: "How bad is it?"  "Can you walk?"   - MILD - Feels unsteady but walking normally.   - MODERATE - Feels very unsteady when walking, but not falling; interferes with normal activities (e.g., school, work) .   - SEVERE - Unable to walk without falling (requires assistance).     Mild- does not effect walking 5. ONSET:  "When did the dizziness begin?"     3 months- more frequent now 6. AGGRAVATING FACTORS: "Does anything make it worse?" (e.g., standing, change in head position)     no 7. CAUSE: "What do you think is causing the dizziness?"     Unknown- mild sensation behind eye- ? Inner ear issue 8. RECURRENT SYMPTOM: "Have you had dizziness before?" If so, ask: "When was the last time?" "What happened that time?"     no 9. OTHER SYMPTOMS: "Do you have any other symptoms?" (e.g., headache, weakness, numbness, vomiting, earache)     Mild headache- nothing major 10. PREGNANCY: "Is there any chance you are pregnant?" "When was your last menstrual period?"       n/a  Protocols used: DIZZINESS - VERTIGO-A-AH

## 2017-09-06 ENCOUNTER — Encounter: Payer: Self-pay | Admitting: Family Medicine

## 2017-09-06 ENCOUNTER — Ambulatory Visit (INDEPENDENT_AMBULATORY_CARE_PROVIDER_SITE_OTHER): Payer: BLUE CROSS/BLUE SHIELD | Admitting: Family Medicine

## 2017-09-06 VITALS — BP 126/84 | HR 66 | Temp 98.0°F | Ht 70.0 in | Wt 274.8 lb

## 2017-09-06 DIAGNOSIS — J321 Chronic frontal sinusitis: Secondary | ICD-10-CM

## 2017-09-06 DIAGNOSIS — R42 Dizziness and giddiness: Secondary | ICD-10-CM

## 2017-09-06 DIAGNOSIS — G4733 Obstructive sleep apnea (adult) (pediatric): Secondary | ICD-10-CM | POA: Diagnosis not present

## 2017-09-06 MED ORDER — PREDNISONE 5 MG PO TABS: ORAL_TABLET | ORAL | 0 refills | Status: DC

## 2017-09-06 MED ORDER — LEVOFLOXACIN 500 MG PO TABS: 500.0000 mg | ORAL_TABLET | Freq: Every day | ORAL | 0 refills | Status: DC

## 2017-09-06 NOTE — Progress Notes (Signed)
Louis Watson is a 62 y.o. male here for an acute visit.  History of Present Illness:   [name] acting as scribe for Dr. Earlene PlaterWallace.  HPI: Patient comes in today for an acute issue. He states that for the last couple months he has had some dizziness when he turns his head. He denies any dizziness when laying down. He says "it feels like my sinus are clogged up".   PMHx, SurgHx, SocialHx, Medications, and Allergies were reviewed in the Visit Navigator and updated as appropriate.  Current Medications:   .  amLODipine (NORVASC) 5 MG tablet, Take 1 tablet (5 mg total) by mouth daily., Disp: 90 tablet, Rfl: 3 .  aspirin EC 81 MG tablet, Take 81 mg by mouth daily., Disp: , Rfl:  .  augmented betamethasone dipropionate (DIPROLENE-AF) 0.05 % ointment, , Disp: , Rfl: 1 .  eszopiclone (LUNESTA) 2 MG TABS tablet, Take 1 tablet (2 mg total) by mouth at bedtime as needed for sleep. Take immediately before bedtime, Disp: 30 tablet, Rfl: 3 .  nitroGLYCERIN (NITRODUR - DOSED IN MG/24 HR) 0.2 mg/hr patch, Place 1/4 of patch over affected region. Remove and replace once daily.  Slightly alter skin placement daily, Disp: 30 patch, Rfl: 1 .  sildenafil (REVATIO) 20 MG tablet, 5 po x 1, take 0.5 to 4 hours prior to sexual activity, Disp: 30 tablet, Rfl: 0 .  metFORMIN (GLUCOPHAGE) 500 MG tablet, Take 1 tablet (500 mg total) by mouth 2 (two) times daily with a meal. (Patient not taking: Reported on 09/06/2017), Disp: 180 tablet, Rfl: 0   Allergies  Allergen Reactions  . Penicillins Rash   Review of Systems:   Pertinent items are noted in the HPI. Otherwise, ROS is negative.  Vitals:   Vitals:   09/06/17 0911  BP: 126/84  Pulse: 66  Temp: 98 F (36.7 C)  TempSrc: Oral  SpO2: 97%  Weight: 274 lb 12.8 oz (124.6 kg)  Height: 5\' 10"  (1.778 m)     Body mass index is 39.43 kg/m.  Physical Exam:   Physical Exam  Constitutional: He is oriented to person, place, and time. He appears  well-developed and well-nourished. No distress.  HENT:  Head: Normocephalic and atraumatic.  Right Ear: External ear normal.  Left Ear: External ear normal.  Nose: Right sinus exhibits frontal sinus tenderness. Left sinus exhibits frontal sinus tenderness.  Mouth/Throat: Oropharynx is clear and moist.  Eyes: Conjunctivae and EOM are normal. Pupils are equal, round, and reactive to light.  Neck: Normal range of motion. Neck supple.  Cardiovascular: Normal rate, regular rhythm, normal heart sounds and intact distal pulses.  Pulmonary/Chest: Effort normal and breath sounds normal.  Abdominal: Soft. Bowel sounds are normal.  Musculoskeletal: Normal range of motion.  Neurological: He is alert and oriented to person, place, and time.  Skin: Skin is warm and dry.  Psychiatric: He has a normal mood and affect. His behavior is normal. Judgment and thought content normal.  Nursing note and vitals reviewed.   Assessment and Plan:   1. Chronic frontal sinusitis Patient complains of a 08-3537-month history of worsening sinus pain and pressure, associated with rhinitis and postnasal drip, also associated with vertigo.  The episodes of vertigo have been every 1-3 days with movement of his head left to right and right to left.  This is increased from the initial onset.  The dizziness and feelings of imbalance last 1-2 seconds before going away.  He denies any nausea, vomiting, headaches,  lightheadedness, edema, or falls.  No head trauma.  No confusion.  We will treat as below for presumed chronic sinusitis.  We reviewed red flags.  He will call in 2 weeks if not improving.  - levofloxacin (LEVAQUIN) 500 MG tablet; Take 1 tablet (500 mg total) by mouth daily.  Dispense: 10 tablet; Refill: 0 - predniSONE (DELTASONE) 5 MG tablet; 6-5-4-3-2-1-off  Dispense: 21 tablet; Refill: 0  2. Vertigo As above.  No meclizine recommended today due to the limited time span of symptoms.  3. OSA (obstructive sleep  apnea) Patient uses a CPAP at home.  He does need new supplies.  We discussed proper cleaning of his supplies.  Order as below.  - For home use only DME continuous positive airway pressure (CPAP)   . Reviewed expectations re: course of current medical issues. . Discussed self-management of symptoms. . Outlined signs and symptoms indicating need for more acute intervention. . Patient verbalized understanding and all questions were answered. Marland Kitchen Health Maintenance issues including appropriate healthy diet, exercise, and smoking avoidance were discussed with patient. . See orders for this visit as documented in the electronic medical record. . Patient received an After Visit Summary.  CMA served as Neurosurgeon during this visit. History, Physical, and Plan performed by medical provider. The above documentation has been reviewed and is accurate and complete. Helane Rima, D.O.   Helane Rima, DO [place] 09/06/2017

## 2017-09-06 NOTE — Patient Instructions (Addendum)

## 2017-10-02 ENCOUNTER — Other Ambulatory Visit: Payer: Self-pay | Admitting: Family Medicine

## 2017-10-02 DIAGNOSIS — G4700 Insomnia, unspecified: Secondary | ICD-10-CM

## 2017-10-03 ENCOUNTER — Other Ambulatory Visit: Payer: Self-pay | Admitting: Family Medicine

## 2017-10-03 DIAGNOSIS — G4700 Insomnia, unspecified: Secondary | ICD-10-CM

## 2017-10-03 NOTE — Telephone Encounter (Signed)
Pt is calling to make sure his refill request has been received. He needs before Wednesday - he will be going out of town.  Please call [phone]

## 2017-10-03 NOTE — Telephone Encounter (Signed)
Ok to refill 

## 2017-10-03 NOTE — Telephone Encounter (Signed)
Okay refill. 

## 2017-10-11 ENCOUNTER — Telehealth: Payer: Self-pay | Admitting: Family Medicine

## 2017-10-11 NOTE — Telephone Encounter (Signed)
Copied from CRM [id number]. Topic: Quick Communication - See Telephone Encounter >> Oct 11, 2017  3:49 PM Clack, Princella PellegriniJessica D wrote: CRM for notification. See Telephone encounter for: Tiffany from [employer] states the pt would need an order for a sleep study before replacing parts, without the sleep study records.  She also states pt can buy parts if he would like and pay out of pocket.  Contact [phone]  10/11/17.

## 2017-10-12 NOTE — Telephone Encounter (Signed)
Spoke with patient and he stated that he does not remember where he had the sleep study done. He is going to try and locate then have it sent to the office. He does not want to have another sleep study done. He stated that it has been about 9 years since last sleep study.

## 2017-10-14 ENCOUNTER — Ambulatory Visit: Payer: Self-pay | Admitting: *Deleted

## 2017-10-14 NOTE — Telephone Encounter (Signed)
Caller Name: Tammy SoursGreg Phone: [phone]   Pt is faxing in copy of sleep study today to [phone]

## 2017-10-14 NOTE — Telephone Encounter (Signed)
Please advise 

## 2017-10-14 NOTE — Telephone Encounter (Signed)
Hold for report.

## 2017-10-14 NOTE — Telephone Encounter (Signed)
Patient is calling to report that he has not improved after treatment and he is actually having increased symptoms. Per protocol- patient needs to be seen within 3 days- appointment scheduled 2/27 per patient work schedule. Office notified.    Reason for Disposition . [1] MODERATE dizziness (e.g., interferes with normal activities) AND [2] has been evaluated by physician for this  Answer Assessment - Initial Assessment Questions 1. DESCRIPTION: "Describe your dizziness."     Lightheaded- vision changes in left eye- cataract beginning, waves 2. LIGHTHEADED: "Do you feel lightheaded?" (e.g., somewhat faint, woozy, weak upon standing)     Shift eyes quickly- works on 2 monitors, loud noises- quick head motion 3. VERTIGO: "Do you feel like either you or the room is spinning or tilting?" (i.e. vertigo)     A little bit 4. SEVERITY: "How bad is it?"  "Do you feel like you are going to faint?" "Can you stand and walk?"   - MILD - walking normally   - MODERATE - interferes with normal activities (e.g., work, school)    - SEVERE - unable to stand, requires support to walk, feels like passing out now.      Mild- can't concentrate as well 5. ONSET:  "When did the dizziness begin?"     3 months- progressively worse- feels daily now 6. AGGRAVATING FACTORS: "Does anything make it worse?" (e.g., standing, change in head position)     Changing head position, rapid eye movement 7. HEART RATE: "Can you tell me your heart rate?" "How many beats in 15 seconds?"  (Note: not all patients can do this)       No heart racing- normal rate and beat 8. CAUSE: "What do you think is causing the dizziness?"     Unknown- blood flow- eye sight 9. RECURRENT SYMPTOM: "Have you had dizziness before?" If so, ask: "When was the last time?" "What happened that time?"     Ongoing problem 10. OTHER SYMPTOMS: "Do you have any other symptoms?" (e.g., fever, chest pain, vomiting, diarrhea, bleeding)       no 11. PREGNANCY: "Is  there any chance you are pregnant?" "When was your last menstrual period?"       n/a  Protocols used: DIZZINESS [facility]- LIGHTHEADEDNESS-A-AH

## 2017-10-14 NOTE — Telephone Encounter (Signed)
See note

## 2017-10-15 NOTE — Telephone Encounter (Signed)
To ER or UC over the weekend. If vision changes, not c/w vertigo - to ER.

## 2017-10-17 ENCOUNTER — Other Ambulatory Visit: Payer: Self-pay

## 2017-10-17 DIAGNOSIS — R7303 Prediabetes: Secondary | ICD-10-CM

## 2017-10-17 MED ORDER — METFORMIN HCL 500 MG PO TABS
500.0000 mg | ORAL_TABLET | Freq: Two times a day (BID) | ORAL | 2 refills | Status: DC
Start: 2017-10-17 — End: 2018-02-27

## 2017-10-17 NOTE — Telephone Encounter (Signed)
Patient called was given all information. States symptoms have not increased at all. If they do he will get evaluation ASAP.

## 2017-10-19 ENCOUNTER — Ambulatory Visit (INDEPENDENT_AMBULATORY_CARE_PROVIDER_SITE_OTHER): Payer: BLUE CROSS/BLUE SHIELD | Admitting: Family Medicine

## 2017-10-19 ENCOUNTER — Encounter: Payer: Self-pay | Admitting: Family Medicine

## 2017-10-19 VITALS — BP 130/82 | HR 68 | Temp 98.2°F | Wt 279.4 lb

## 2017-10-19 DIAGNOSIS — R42 Dizziness and giddiness: Secondary | ICD-10-CM

## 2017-10-19 DIAGNOSIS — R29818 Other symptoms and signs involving the nervous system: Secondary | ICD-10-CM | POA: Diagnosis not present

## 2017-10-19 DIAGNOSIS — H539 Unspecified visual disturbance: Secondary | ICD-10-CM

## 2017-10-19 LAB — CBC WITH DIFFERENTIAL/PLATELET
Basophils Absolute: 0.1 10*3/uL (ref 0.0–0.1)
Basophils Relative: 0.7 % (ref 0.0–3.0)
Eosinophils Absolute: 0.1 10*3/uL (ref 0.0–0.7)
Eosinophils Relative: 1.2 % (ref 0.0–5.0)
HCT: 45.8 % (ref 39.0–52.0)
Hemoglobin: 15.8 g/dL (ref 13.0–17.0)
Lymphocytes Relative: 26.4 % (ref 12.0–46.0)
Lymphs Abs: 2.3 10*3/uL (ref 0.7–4.0)
MCHC: 34.4 g/dL (ref 30.0–36.0)
MCV: 97.7 fl (ref 78.0–100.0)
Monocytes Absolute: 0.6 10*3/uL (ref 0.1–1.0)
Monocytes Relative: 7.4 % (ref 3.0–12.0)
Neutro Abs: 5.5 10*3/uL (ref 1.4–7.7)
Neutrophils Relative %: 64.3 % (ref 43.0–77.0)
Platelets: 254 10*3/uL (ref 150.0–400.0)
RBC: 4.69 Mil/uL (ref 4.22–5.81)
RDW: 12.6 % (ref 11.5–15.5)
WBC: 8.5 10*3/uL (ref 4.0–10.5)

## 2017-10-19 LAB — COMPREHENSIVE METABOLIC PANEL
ALT: 20 U/L (ref 0–53)
AST: 21 U/L (ref 0–37)
Albumin: 3.9 g/dL (ref 3.5–5.2)
Alkaline Phosphatase: 66 U/L (ref 39–117)
BUN: 18 mg/dL (ref 6–23)
CO2: 29 mEq/L (ref 19–32)
Calcium: 9.4 mg/dL (ref 8.4–10.5)
Chloride: 102 mEq/L (ref 96–112)
Creatinine, Ser: 0.97 mg/dL (ref 0.40–1.50)
GFR: 83.21 mL/min (ref >60.00)
Glucose, Bld: 94 mg/dL (ref 70–99)
Potassium: 4.4 mEq/L (ref 3.5–5.1)
Sodium: 138 mEq/L (ref 135–145)
Total Bilirubin: 0.8 mg/dL (ref 0.2–1.2)
Total Protein: 6.8 g/dL (ref 6.0–8.3)

## 2017-10-19 LAB — VITAMIN B12: Vitamin B-12: 650 pg/mL (ref 211–911)

## 2017-10-19 LAB — TSH: TSH: 1.85 u[IU]/mL (ref 0.35–4.50)

## 2017-10-19 NOTE — Progress Notes (Signed)
Louis Watson is a 62 y.o. male is here for follow up.  History of Present Illness:   Louis MortJoEllen Calisha Watson, CMA acting as scribe for Dr. Helane RimaErica Wallace.   Dizziness  This is a new problem. The current episode started 1 to 4 weeks ago. The problem occurs daily. The problem has been gradually worsening. Associated symptoms include headaches and a visual change. Pertinent negatives include no abdominal pain, change in bowel habit, chest pain, chills, congestion, coughing, fatigue, fever, nausea or urinary symptoms. Exacerbated by: when reading or moving head  He has tried nothing for the symptoms. The treatment provided no relief.   Changes in position does not make it worse.   Review of Systems  Constitutional: Negative for chills, fatigue and fever.  HENT: Negative for congestion and hearing loss.   Eyes: Positive for blurred vision.       Seen by eye doctor and was given change in prescription.   Respiratory: Negative for cough.   Cardiovascular: Negative for chest pain.  Gastrointestinal: Negative for abdominal pain, change in bowel habit and nausea.  Genitourinary: Negative for dysuria and urgency.  Neurological: Positive for dizziness and headaches.   There are no preventive care reminders to display for this patient.   Depression screen PHQ 2/9 01/11/2017  Decreased Interest 0  Down, Depressed, Hopeless 0  PHQ - 2 Score 0   PMHx, SurgHx, SocialHx, FamHx, Medications, and Allergies were reviewed in the Visit Navigator and updated as appropriate.   Patient Active Problem List   Diagnosis Date Noted  . Eczema 06/17/2017  . Partial tear of right subscapularis tendon 01/25/2017  . Hx of pancreatitis 01/17/2017  . Hx of lumbar discectomy 01/17/2017  . Diverticulosis 01/17/2017  . Erectile dysfunction 01/15/2017  . Chronic right shoulder pain 01/15/2017  . Bursitis of shoulder 09/10/2015  . Hypertension 10/29/2014  . Insomnia 10/29/2014  . Sleep apnea 10/29/2014   Social  History   Tobacco Use  . Smoking status: Current Some Day Smoker    Types: Cigars  . Smokeless tobacco: Never Used  Substance Use Topics  . Alcohol use: No  . Drug use: No   Current Medications and Allergies:   .  amLODipine (NORVASC) 5 MG tablet, Take 1 tablet (5 mg total) by mouth daily., Disp: 90 tablet, Rfl: 3 .  aspirin EC 81 MG tablet, Take 81 mg by mouth daily., Disp: , Rfl:  .  augmented betamethasone dipropionate (DIPROLENE-AF) 0.05 % ointment, , Disp: , Rfl: 1 .  eszopiclone (LUNESTA) 2 MG TABS tablet, TAKE 1 TABLET BY MOUTH AT BEDTIME AS NEEDED FOR SLEEP. TAKE IMMEDIATELY BEFORE BED, Disp: 30 tablet, Rfl: 0 .  metFORMIN (GLUCOPHAGE) 500 MG tablet, Take 1 tablet (500 mg total) by mouth 2 (two) times daily with a meal., Disp: 180 tablet, Rfl: 2 .  sildenafil (REVATIO) 20 MG tablet, 5 po x 1, take 0.5 to 4 hours prior to sexual activity, Disp: 30 tablet, Rfl: 0   Allergies  Allergen Reactions  . Penicillins Rash   Review of Systems   Pertinent items are noted in the HPI. Otherwise, ROS is negative.  Vitals:   Vitals:   10/19/17 1257  BP: 130/82  Pulse: 68  Temp: 98.2 F (36.8 C)  TempSrc: Oral  Weight: 279 lb 6.4 oz (126.7 kg)     Body mass index is 40.09 kg/m.  Physical Exam:   Physical Exam  Constitutional: He is oriented to person, place, and time. He appears well-developed and well-nourished.  No distress.  HENT:  Head: Normocephalic and atraumatic.  Right Ear: External ear normal.  Left Ear: External ear normal.  Nose: Nose normal.  Mouth/Throat: Oropharynx is clear and moist.  Eyes: Conjunctivae and EOM are normal. Pupils are equal, round, and reactive to light.  Neck: Normal range of motion. Neck supple.  Cardiovascular: Normal rate, regular rhythm, normal heart sounds and intact distal pulses.  Pulmonary/Chest: Effort normal and breath sounds normal.  Abdominal: Soft. Bowel sounds are normal.  Musculoskeletal: Normal range of motion.    Neurological: He is alert and oriented to person, place, and time.  Skin: Skin is warm and dry.  Psychiatric: He has a normal mood and affect. His behavior is normal. Judgment and thought content normal.  Nursing note and vitals reviewed.   Results for orders placed or performed in visit on 10/19/17  CBC with Differential/Platelet  Result Value Ref Range   WBC 8.5 4.0 - 10.5 K/uL   RBC 4.69 4.22 - 5.81 Mil/uL   Hemoglobin 15.8 13.0 - 17.0 g/dL   HCT 91.4 78.2 - 95.6 %   MCV 97.7 78.0 - 100.0 fl   MCHC 34.4 30.0 - 36.0 g/dL   RDW 21.3 08.6 - 57.8 %   Platelets 254.0 150.0 - 400.0 K/uL   Neutrophils Relative % 64.3 43.0 - 77.0 %   Lymphocytes Relative 26.4 12.0 - 46.0 %   Monocytes Relative 7.4 3.0 - 12.0 %   Eosinophils Relative 1.2 0.0 - 5.0 %   Basophils Relative 0.7 0.0 - 3.0 %   Neutro Abs 5.5 1.4 - 7.7 K/uL   Lymphs Abs 2.3 0.7 - 4.0 K/uL   Monocytes Absolute 0.6 0.1 - 1.0 K/uL   Eosinophils Absolute 0.1 0.0 - 0.7 K/uL   Basophils Absolute 0.1 0.0 - 0.1 K/uL  Comprehensive metabolic panel  Result Value Ref Range   Sodium 138 135 - 145 mEq/L   Potassium 4.4 3.5 - 5.1 mEq/L   Chloride 102 96 - 112 mEq/L   CO2 29 19 - 32 mEq/L   Glucose, Bld 94 70 - 99 mg/dL   BUN 18 6 - 23 mg/dL   Creatinine, Ser 4.69 0.40 - 1.50 mg/dL   Total Bilirubin 0.8 0.2 - 1.2 mg/dL   Alkaline Phosphatase 66 39 - 117 U/L   AST 21 0 - 37 U/L   ALT 20 0 - 53 U/L   Total Protein 6.8 6.0 - 8.3 g/dL   Albumin 3.9 3.5 - 5.2 g/dL   Calcium 9.4 8.4 - 62.9 mg/dL   GFR 52.84 >13.24 mL/min  TSH  Result Value Ref Range   TSH 1.85 0.35 - 4.50 uIU/mL  Vitamin B12  Result Value Ref Range   Vitamin B-12 650 211 - 911 pg/mL   Assessment and Plan:   Patient presents for reevaluation of dizziness.  He was seen a few weeks ago and reported some sinus pressure at that time as well so we treated for sinus infection.  Unfortunately, he has had no improvement in his symptoms.  It is difficult for him to  describe the way he feels.  He states that he sits at a computer for most of the day due to work.  He has a 2 monitor set up and has to constantly look between the 2 monitors.  He says that his head does not move but his eyes will move.  More often now he is noticing that he gets an overwhelming feeling of disorientation when looking between  the monitors.  He denies any headaches, blurry vision or double vision, chest pain, shortness of breath, or other systemic symptoms.  There are no triggers other than looking at the computer.  No history of migraines.  No medication changes or diet changes or weight loss.  He does have a history of hypertension that is controlled.  He is on a baby aspirin a day.  No history of the same.  Patient did go to his ophthalmologist after our last visit was told that he has mild cataracts on the left without need for current surgery.  Otherwise, his exam, including a dilated eye exam, was normal.  Labs above are reassuring.  Differential diagnosis includes migraine variant but this also includes vascular red flags.  I will go ahead and refer to neurology and tried to obtain brain MRI/MRA as well.  1. Dizziness - CBC with Differential/Platelet - Comprehensive metabolic panel - TSH - Vitamin B12 - MR Angiogram Head Wo Contrast - MR Brain W Wo Contrast - Ambulatory referral to Neurology  2. Vision changes - CBC with Differential/Platelet - Comprehensive metabolic panel - TSH - Vitamin B12 - MR Angiogram Head Wo Contrast - MR Brain W Wo Contrast - Ambulatory referral to Neurology  3. Other symptoms and signs involving the nervous system - MR Brain W Wo Contrast - Ambulatory referral to Neurology  . Reviewed expectations re: course of current medical issues. . Discussed self-management of symptoms. . Outlined signs and symptoms indicating need for more acute intervention. . Patient verbalized understanding and all questions were answered. Marland Kitchen Health Maintenance  issues including appropriate healthy diet, exercise, and smoking avoidance were discussed with patient. . See orders for this visit as documented in the electronic medical record. . Patient received an After Visit Summary.  CMA served as Neurosurgeon during this visit. History, Physical, and Plan performed by medical provider. The above documentation has been reviewed and is accurate and complete. Helane Rima, D.O.  Helane Rima, DO [place] 10/22/2017

## 2017-10-19 NOTE — Progress Notes (Deleted)
   Louis Watson is a 62 y.o. male here for an acute visit.  History of Present Illness:   HPI:   PMHx, SurgHx, SocialHx, Medications, and Allergies were reviewed in the Visit Navigator and updated as appropriate.  Current Medications:   Current Outpatient Medications:  .  amLODipine (NORVASC) 5 MG tablet, Take 1 tablet (5 mg total) by mouth daily., Disp: 90 tablet, Rfl: 3 .  aspirin EC 81 MG tablet, Take 81 mg by mouth daily., Disp: , Rfl:  .  augmented betamethasone dipropionate (DIPROLENE-AF) 0.05 % ointment, , Disp: , Rfl: 1 .  eszopiclone (LUNESTA) 2 MG TABS tablet, TAKE 1 TABLET BY MOUTH AT BEDTIME AS NEEDED FOR SLEEP. TAKE IMMEDIATELY BEFORE BED, Disp: 30 tablet, Rfl: 0 .  levofloxacin (LEVAQUIN) 500 MG tablet, Take 1 tablet (500 mg total) by mouth daily., Disp: 10 tablet, Rfl: 0 .  metFORMIN (GLUCOPHAGE) 500 MG tablet, Take 1 tablet (500 mg total) by mouth 2 (two) times daily with a meal., Disp: 180 tablet, Rfl: 2 .  nitroGLYCERIN (NITRODUR - DOSED IN MG/24 HR) 0.2 mg/hr patch, Place 1/4 of patch over affected region. Remove and replace once daily.  Slightly alter skin placement daily, Disp: 30 patch, Rfl: 1 .  predniSONE (DELTASONE) 5 MG tablet, 6-5-4-3-2-1-off, Disp: 21 tablet, Rfl: 0 .  sildenafil (REVATIO) 20 MG tablet, 5 po x 1, take 0.5 to 4 hours prior to sexual activity, Disp: 30 tablet, Rfl: 0   Allergies  Allergen Reactions  . Penicillins Rash   Review of Systems:   Pertinent items are noted in the HPI. Otherwise, ROS is negative.  Vitals:  There were no vitals filed for this visit.   There is no height or weight on file to calculate BMI.  Physical Exam:   Physical Exam  Assessment and Plan:   ***

## 2017-10-20 NOTE — Telephone Encounter (Signed)
Patient came in office yesterday for follow up. He will bring copy of report by our office later this week.

## 2017-10-22 ENCOUNTER — Encounter: Payer: Self-pay | Admitting: Family Medicine

## 2017-10-24 ENCOUNTER — Encounter: Payer: Self-pay | Admitting: Family Medicine

## 2017-10-24 ENCOUNTER — Ambulatory Visit: Payer: BLUE CROSS/BLUE SHIELD | Admitting: Family Medicine

## 2017-10-25 ENCOUNTER — Encounter: Payer: Self-pay | Admitting: Neurology

## 2017-10-25 ENCOUNTER — Ambulatory Visit (INDEPENDENT_AMBULATORY_CARE_PROVIDER_SITE_OTHER): Payer: BLUE CROSS/BLUE SHIELD | Admitting: Neurology

## 2017-10-25 VITALS — BP 124/82 | HR 68 | Ht 70.0 in | Wt 279.0 lb

## 2017-10-25 DIAGNOSIS — R42 Dizziness and giddiness: Secondary | ICD-10-CM

## 2017-10-25 NOTE — Progress Notes (Signed)
PATIENT: Louis Watson DOB: 06/09/1956  Chief Complaint  Patient presents with  . New Patient (Initial Visit)    Referring/PCP: Dr. Earlene Plater, dizziness and L eye blurriness when waking up for 2 hours. Vision: without correction, R: 20/40, L: 20/40, B: 20/40. Orthos: Lying: 128/86, 68, Sitting: 124/82, 68, Standing: 122/80, 68     HISTORICAL  Louis Watson is a 62 year old male, seen in refer by primary care doctor Helane Rima for evaluation of dizziness, initial evaluation was on October 25, 2017.  I reviewed and summarized the referring note, he has history of diabetes, hypertension, pancreatitis, lumbar decompression surgery  He had frequent dizziness difficulty focusing since summer of 2018,"as if a beer buzz", it can happen in a sitting position, or standing up position, when he shift his visual focus between screens, he felt transient dizziness, but no vertigo, it started as intermittent couple times a week, then gradually getting worse, become more frequent, now few times each day, each episode lasted about few seconds, no vertigo, no loss of consciousness,  He denied lightheadedness when he got up quickly from seated position, no dizziness when his first set up in the morning,  He drink about 32 ounces of caffeine-containing beverage each day, sedentary lifestyle, obesity, uses CPAP machine, but said he has not adjusted in the past 2 years, he rely on Lunesta or Ambien to sleep every night,  He was recently diagnosed with diabetes, was given a prescription of Metformin, but is not compliant with it.  He has become very sedentary since 2017 due to bilateral fasciitis.  There is no orthostatic blood pressure change on today's examination  REVIEW OF SYSTEMS: Full 14 system review of systems performed and notable only for dizziness, headaches, blurred vision,  ALLERGIES: Allergies  Allergen Reactions  . Penicillins Rash    HOME MEDICATIONS: Current Outpatient Medications    Medication Sig Dispense Refill  . amLODipine (NORVASC) 5 MG tablet Take 1 tablet (5 mg total) by mouth daily. 90 tablet 3  . aspirin EC 81 MG tablet Take 81 mg by mouth daily.    Marland Kitchen augmented betamethasone dipropionate (DIPROLENE-AF) 0.05 % ointment   1  . eszopiclone (LUNESTA) 2 MG TABS tablet TAKE 1 TABLET BY MOUTH AT BEDTIME AS NEEDED FOR SLEEP. TAKE IMMEDIATELY BEFORE BED 30 tablet 0  . metFORMIN (GLUCOPHAGE) 500 MG tablet Take 1 tablet (500 mg total) by mouth 2 (two) times daily with a meal. 180 tablet 2  . sildenafil (REVATIO) 20 MG tablet 5 po x 1, take 0.5 to 4 hours prior to sexual activity 30 tablet 0   No current facility-administered medications for this visit.     PAST MEDICAL HISTORY: Past Medical History:  Diagnosis Date  . Diverticulosis 01/17/2017  . Hx of lumbar discectomy 01/17/2017  . Hx of pancreatitis 01/17/2017  . Hypertension   . Pancreatitis 2013  . Plantar fasciitis     PAST SURGICAL HISTORY: Past Surgical History:  Procedure Laterality Date  . KNEE ARTHROSCOPY  2013  . LUMBAR DISC SURGERY  1993    FAMILY HISTORY: Family History  Problem Relation Age of Onset  . Congestive Heart Failure Mother     SOCIAL HISTORY:  Social History   Socioeconomic History  . Marital status: Married    Spouse name: Not on file  . Number of children: Not on file  . Years of education: Not on file  . Highest education level: Not on file  Social Needs  . Financial resource strain:  Not on file  . Food insecurity - worry: Not on file  . Food insecurity - inability: Not on file  . Transportation needs - medical: Not on file  . Transportation needs - non-medical: Not on file  Occupational History  . Not on file  Tobacco Use  . Smoking status: Current Some Day Smoker    Types: Cigars  . Smokeless tobacco: Never Used  Substance and Sexual Activity  . Alcohol use: No  . Drug use: No  . Sexual activity: Not on file  Other Topics Concern  . Not on file  Social  History Narrative  . Not on file     PHYSICAL EXAM   Vitals:   10/25/17 1100  BP: 124/82  Pulse: 68  Weight: 279 lb (126.6 kg)  Height: 5\' 10"  (1.778 m)    Not recorded      Body mass index is 40.03 kg/m.  PHYSICAL EXAMNIATION:  Gen: NAD, conversant, well nourised, obese, well groomed                     Cardiovascular: Regular rate rhythm, no peripheral edema, warm, nontender. Eyes: Conjunctivae clear without exudates or hemorrhage Neck: Supple, no carotid bruits. Pulmonary: Clear to auscultation bilaterally   NEUROLOGICAL EXAM:  MENTAL STATUS: Speech:    Speech is normal; fluent and spontaneous with normal comprehension.  Cognition:     Orientation to time, place and person     Normal recent and remote memory     Normal Attention span and concentration     Normal Language, naming, repeating,spontaneous speech     Fund of knowledge   CRANIAL NERVES: CN II: Visual fields are full to confrontation. Fundoscopic exam is normal with sharp discs and no vascular changes. Pupils are round equal and briskly reactive to light. CN III, IV, VI: extraocular movement are normal. No ptosis. CN V: Facial sensation is intact to pinprick in all 3 divisions bilaterally. Corneal responses are intact.  CN VII: Face is symmetric with normal eye closure and smile. CN VIII: Hearing is normal to rubbing fingers CN IX, X: Palate elevates symmetrically. Phonation is normal. CN XI: Head turning and shoulder shrug are intact CN XII: Tongue is midline with normal movements and no atrophy.  MOTOR: There is no pronator drift of out-stretched arms. Muscle bulk and tone are normal. Muscle strength is normal.  REFLEXES: Reflexes are 2+ and symmetric at the biceps, triceps, knees, and ankles. Plantar responses are flexor.  SENSORY: Intact to light touch, pinprick, positional sensation and vibratory sensation are intact in fingers and toes.  COORDINATION: Rapid alternating movements and fine  finger movements are intact. There is no dysmetria on finger-to-nose and heel-knee-shin.    GAIT/STANCE: Posture is normal. Gait is steady with normal steps, base, arm swing, and turning. Heel and toe walking are normal. Tandem gait is normal.  Romberg is absent.   DIAGNOSTIC DATA (LABS, IMAGING, TESTING) - I reviewed patient records, labs, notes, testing and imaging myself where available.   ASSESSMENT AND PLAN  Louis Watson is a 62 y.o. male    Transient dizziness, difficulty focusing,  Nonspecific, difficult to localize, potential etiology including deconditioning, medicine side effect, glucose or blood pressure variations,  MRI of the brain, MRA of the brain are scheduled by his primary care physician  I have encouraged him moderate exercise, weight loss, increase water intake,  Levert Feinstein, M.D. Ph.D.  [facility] [address], [address] [place], [place] [address] Ph: (  [phone] Fax: [phone]  GN:[id number]:Wallace, Alcario DroughtErica, DO

## 2017-11-02 ENCOUNTER — Ambulatory Visit
Admission: RE | Admit: 2017-11-02 | Discharge: 2017-11-02 | Disposition: A | Discharge status: Home / Self Care | Payer: BLUE CROSS/BLUE SHIELD | Source: Ambulatory Visit | Attending: Family Medicine | Admitting: Family Medicine

## 2017-11-02 ENCOUNTER — Other Ambulatory Visit: Payer: Self-pay | Admitting: Family Medicine

## 2017-11-02 DIAGNOSIS — G4700 Insomnia, unspecified: Secondary | ICD-10-CM

## 2017-11-02 DIAGNOSIS — R42 Dizziness and giddiness: Secondary | ICD-10-CM | POA: Diagnosis not present

## 2017-11-02 MED ORDER — GADOBENATE DIMEGLUMINE 529 MG/ML IV SOLN
20.0000 mL | Freq: Once | INTRAVENOUS | Status: AC | PRN
  Administered 2017-11-02: 20 mL via INTRAVENOUS

## 2017-11-03 NOTE — Telephone Encounter (Signed)
Ok to fill 

## 2017-11-03 NOTE — Telephone Encounter (Signed)
Copied from CRM [id number]. Topic: Quick Communication - Rx Refill/Question >> Nov 03, 2017  9:13 AM Lelon FrohlichGolden, Ed Mandich, RMA wrote: Medication: lunesta 2 mg   Has the patient contacted their pharmacy yes   (Agent: If no, request that the patient contact the pharmacy for the refill.)   Preferred Pharmacy (with phone number or street name): [facility] [address]   Agent: Please be advised that RX refills may take up to 3 business days. We ask that you follow-up with your pharmacy.

## 2017-11-09 ENCOUNTER — Encounter: Payer: Self-pay | Admitting: Family Medicine

## 2017-11-23 ENCOUNTER — Telehealth: Payer: Self-pay | Admitting: Family Medicine

## 2017-11-23 ENCOUNTER — Other Ambulatory Visit: Payer: Self-pay | Admitting: *Deleted

## 2017-11-23 DIAGNOSIS — N529 Male erectile dysfunction, unspecified: Secondary | ICD-10-CM

## 2017-11-23 MED ORDER — SILDENAFIL CITRATE 20 MG PO TABS: ORAL_TABLET | ORAL | 0 refills | Status: DC

## 2017-11-23 NOTE — Telephone Encounter (Signed)
Copied from CRM [phone]. Topic: Quick Communication - Rx Refill/Question >> Nov 23, 2017 10:46 AM Oneal GroutSebastian, Jennifer S wrote: Medication: sildenafil (REVATIO) 20 MG tablet  Has the patient contacted their pharmacy? Yes.   (Agent: If no, request that the patient contact the pharmacy for the refill.) Preferred Pharmacy (with phone number or street name): [facility] [address] Agent: Please be advised that RX refills may take up to 3 business days. We ask that you follow-up with your pharmacy.

## 2017-12-05 ENCOUNTER — Other Ambulatory Visit: Payer: Self-pay | Admitting: Family Medicine

## 2017-12-05 ENCOUNTER — Telehealth: Payer: Self-pay | Admitting: Family Medicine

## 2017-12-05 DIAGNOSIS — G4700 Insomnia, unspecified: Secondary | ICD-10-CM

## 2017-12-05 NOTE — Telephone Encounter (Signed)
Ok to refill 

## 2017-12-05 NOTE — Telephone Encounter (Signed)
Lunesta 2 mg tab.  LOV 10/19/17 with Dr. Earlene PlaterWallace  [facility] [address] [facility] [place], [place] - [address]

## 2017-12-05 NOTE — Telephone Encounter (Signed)
Please advise on refill.

## 2017-12-05 NOTE — Telephone Encounter (Signed)
Copied from CRM #[id number]. Topic: Quick Communication - Rx Refill/Question >> Dec 05, 2017 10:39 AM Rudi CocoLathan, Naoko Diperna M, NT wrote: Medication: eszopiclone (LUNESTA) 2 MG TABS tablet [[id number]][[id number]]  Has the patient contacted their pharmacy? no (Agent: If no, request that the patient contact the pharmacy for the refill.) Preferred Pharmacy (with phone number or street name): [id number] [place], [place] - [address] [place] [address] Phone: [phone] Fax: [phone]   Agent: Please be advised that RX refills may take up to 3 business days. We ask that you follow-up with your pharmacy.

## 2017-12-06 ENCOUNTER — Other Ambulatory Visit: Payer: Self-pay | Admitting: Family Medicine

## 2017-12-06 DIAGNOSIS — G4700 Insomnia, unspecified: Secondary | ICD-10-CM

## 2017-12-06 NOTE — Telephone Encounter (Signed)
Copied from CRM #[id number]. Topic: Quick Communication - Rx Refill/Question >> Dec 05, 2017 10:39 AM Rudi Coco, NT wrote: Medication: eszopiclone (LUNESTA) 2 MG TABS tablet [[id number]]  Has the patient contacted their pharmacy? no (Agent: If no, request that the patient contact the pharmacy for the refill.) Preferred Pharmacy (with phone number or street name): [id number] [place], [place] - [address] [place] [address] Phone: [phone] Fax: [phone]   Agent: Please be advised that RX refills may take up to 3 business days. We ask that you follow-up with your pharmacy. >> Dec 05, 2017  3:41 PM Terisa Starr wrote: Patient said that he contacted the pharmacy about 30 mins ago and the pharmacy does not have the script. It looks like it was sent at 10:49am but they do not have it

## 2017-12-06 NOTE — Telephone Encounter (Signed)
See note

## 2017-12-06 NOTE — Telephone Encounter (Signed)
Called pharmacy patient picked up meds yesterday.

## 2017-12-06 NOTE — Telephone Encounter (Signed)
Lunesta LOV: 07/26/17 PCP: Helane RimaErica Wallace [facility] [address] [place], [place]

## 2018-01-02 ENCOUNTER — Other Ambulatory Visit: Payer: Self-pay | Admitting: Family Medicine

## 2018-01-02 DIAGNOSIS — G4700 Insomnia, unspecified: Secondary | ICD-10-CM

## 2018-01-02 NOTE — Telephone Encounter (Signed)
Copied from CRM [id number]. Topic: Quick Communication - Rx Refill/Question >> Jan 02, 2018  3:50 PM Maia Petties wrote: Medication: lunesta - 2-3 days left, 1/day Has the patient contacted their pharmacy? No - no refills [facility] phone number [facility] [address]

## 2018-01-03 NOTE — Telephone Encounter (Signed)
Please advise on refill.

## 2018-01-03 NOTE — Telephone Encounter (Signed)
Refill request for Lunesta, last filled on 12/05/17 #30.  LOV:10/19/17 Dr. Earlene Plater  [facility] [address]

## 2018-01-04 MED ORDER — ESZOPICLONE 2 MG PO TABS: ORAL_TABLET | ORAL | 0 refills | Status: DC

## 2018-01-25 ENCOUNTER — Ambulatory Visit (INDEPENDENT_AMBULATORY_CARE_PROVIDER_SITE_OTHER): Payer: BLUE CROSS/BLUE SHIELD | Admitting: Neurology

## 2018-01-25 ENCOUNTER — Encounter: Payer: Self-pay | Admitting: Neurology

## 2018-01-25 VITALS — BP 126/82 | HR 69 | Ht 70.0 in | Wt 278.0 lb

## 2018-01-25 DIAGNOSIS — R42 Dizziness and giddiness: Secondary | ICD-10-CM

## 2018-01-25 NOTE — Progress Notes (Signed)
PATIENT: Louis Watson DOB: 09-29-1955  Chief Complaint  Patient presents with  . Dizziness    He completed his brain MRI and MRA, ordered by his PCP.  No cause for his dizziness was determined.      HISTORICAL  Louis AstersGlenn Reaser is a 62 year old male, seen in refer by primary care doctor Helane RimaWallace, Erica for evaluation of dizziness, initial evaluation was on October 25, 2017.  I reviewed and summarized the referring note, he has history of diabetes, hypertension, pancreatitis, lumbar decompression surgery  He had frequent dizziness difficulty focusing since summer of 2018,"as if a beer buzz", it can happen in a sitting position, or standing up position, when he shift his visual focus between screens, he felt transient dizziness, but no vertigo, it started as intermittent couple times a week, then gradually getting worse, become more frequent, now few times each day, each episode lasted about few seconds, no vertigo, no loss of consciousness,  He denied lightheadedness when he got up quickly from seated position, no dizziness when his first set up in the morning,  He drink about 32 ounces of caffeine-containing beverage each day, sedentary lifestyle, obesity, uses CPAP machine, but said he has not adjusted in the past 2 years, he rely on Lunesta or Ambien to sleep every night,  He was recently diagnosed with diabetes, was given a prescription of Metformin, but is not compliant with it.  He has become very sedentary since 2017 due to bilateral fasciitis.  There is no orthostatic blood pressure change on today's examination  UPDATE June 5th 2019: We reviewed MRI brain wo, no acute abnormality,mild small vessel disease. MRA showed no large vessel disease.  He still has intermittent dizziness, this correlated with his recent diagnosis of cataract, is planning on to have surgery soon.  REVIEW OF SYSTEMS: Full 14 system review of systems performed and notable only for dizziness, headaches,  blurred vision,  ALLERGIES: Allergies  Allergen Reactions  . Penicillins Rash    HOME MEDICATIONS: Current Outpatient Medications  Medication Sig Dispense Refill  . amLODipine (NORVASC) 5 MG tablet Take 1 tablet (5 mg total) by mouth daily. 90 tablet 3  . aspirin EC 81 MG tablet Take 81 mg by mouth daily.    Marland Kitchen. augmented betamethasone dipropionate (DIPROLENE-AF) 0.05 % ointment   1  . eszopiclone (LUNESTA) 2 MG TABS tablet TAKE 1 TABLET BY MOUTH AT BEDTIME AS NEEDED FOR SLEEP *TAKE IMMEDIATELY BEFORE BED** 30 tablet 0  . metFORMIN (GLUCOPHAGE) 500 MG tablet Take 1 tablet (500 mg total) by mouth 2 (two) times daily with a meal. 180 tablet 2  . sildenafil (REVATIO) 20 MG tablet 5 po x 1, take 0.5 to 4 hours prior to sexual activity 30 tablet 0   No current facility-administered medications for this visit.     PAST MEDICAL HISTORY: Past Medical History:  Diagnosis Date  . Diverticulosis 01/17/2017  . Hx of lumbar discectomy 01/17/2017  . Hx of pancreatitis 01/17/2017  . Hypertension   . Pancreatitis 2013  . Plantar fasciitis     PAST SURGICAL HISTORY: Past Surgical History:  Procedure Laterality Date  . KNEE ARTHROSCOPY  2013  . LUMBAR DISC SURGERY  1993    FAMILY HISTORY: Family History  Problem Relation Age of Onset  . Congestive Heart Failure Mother     SOCIAL HISTORY:  Social History   Socioeconomic History  . Marital status: Married    Spouse name: Not on file  . Number of children: Not  on file  . Years of education: Not on file  . Highest education level: Not on file  Occupational History  . Not on file  Social Needs  . Financial resource strain: Not on file  . Food insecurity:    Worry: Not on file    Inability: Not on file  . Transportation needs:    Medical: Not on file    Non-medical: Not on file  Tobacco Use  . Smoking status: Current Some Day Smoker    Types: Cigars  . Smokeless tobacco: Never Used  Substance and Sexual Activity  . Alcohol  use: No  . Drug use: No  . Sexual activity: Not on file  Lifestyle  . Physical activity:    Days per week: Not on file    Minutes per session: Not on file  . Stress: Not on file  Relationships  . Social connections:    Talks on phone: Not on file    Gets together: Not on file    Attends religious service: Not on file    Active member of club or organization: Not on file    Attends meetings of clubs or organizations: Not on file    Relationship status: Not on file  . Intimate partner violence:    Fear of current or ex partner: Not on file    Emotionally abused: Not on file    Physically abused: Not on file    Forced sexual activity: Not on file  Other Topics Concern  . Not on file  Social History Narrative  . Not on file     PHYSICAL EXAM   Vitals:   01/25/18 0802  BP: 126/82  Pulse: 69  Weight: 278 lb (126.1 kg)  Height: 5\' 10"  (1.778 m)    Not recorded      Body mass index is 39.89 kg/m.  PHYSICAL EXAMNIATION:  Gen: NAD, conversant, well nourised, obese, well groomed                     Cardiovascular: Regular rate rhythm, no peripheral edema, warm, nontender. Eyes: Conjunctivae clear without exudates or hemorrhage Neck: Supple, no carotid bruits. Pulmonary: Clear to auscultation bilaterally   NEUROLOGICAL EXAM:  MENTAL STATUS: Speech:    Speech is normal; fluent and spontaneous with normal comprehension.  Cognition:     Orientation to time, place and person     Normal recent and remote memory     Normal Attention span and concentration     Normal Language, naming, repeating,spontaneous speech     Fund of knowledge   CRANIAL NERVES: CN II: Visual fields are full to confrontation. Fundoscopic exam is normal with sharp discs and no vascular changes. Pupils are round equal and briskly reactive to light. CN III, IV, VI: extraocular movement are normal. No ptosis. CN V: Facial sensation is intact to pinprick in all 3 divisions bilaterally. Corneal  responses are intact.  CN VII: Face is symmetric with normal eye closure and smile. CN VIII: Hearing is normal to rubbing fingers CN IX, X: Palate elevates symmetrically. Phonation is normal. CN XI: Head turning and shoulder shrug are intact CN XII: Tongue is midline with normal movements and no atrophy.  MOTOR: There is no pronator drift of out-stretched arms. Muscle bulk and tone are normal. Muscle strength is normal.  REFLEXES: Reflexes are 2+ and symmetric at the biceps, triceps, knees, and ankles. Plantar responses are flexor.  SENSORY: Intact to light touch, pinprick, positional sensation and  vibratory sensation are intact in fingers and toes.  COORDINATION: Rapid alternating movements and fine finger movements are intact. There is no dysmetria on finger-to-nose and heel-knee-shin.    GAIT/STANCE: Posture is normal. Gait is steady with normal steps, base, arm swing, and turning. Heel and toe walking are normal. Tandem gait is normal.  Romberg is absent.   DIAGNOSTIC DATA (LABS, IMAGING, TESTING) - I reviewed patient records, labs, notes, testing and imaging myself where available.   ASSESSMENT AND PLAN  Lattie Cervi is a 62 y.o. male    Transient dizziness, difficulty focusing,  Nonspecific, difficult to localize, potential etiology including deconditioning, medicine side effect, glucose or blood pressure variations, misalignment of vision.  No significant worsening  Continue to observe.    Levert Feinstein, M.D. Ph.D.  [facility] [address], [address] [place], [place] [address] Ph: [phone] Fax: [phone]  [id number], Alcario Drought, [place]

## 2018-01-31 ENCOUNTER — Other Ambulatory Visit: Payer: Self-pay | Admitting: Family Medicine

## 2018-01-31 DIAGNOSIS — N529 Male erectile dysfunction, unspecified: Secondary | ICD-10-CM

## 2018-01-31 DIAGNOSIS — G4700 Insomnia, unspecified: Secondary | ICD-10-CM

## 2018-02-01 MED ORDER — SILDENAFIL CITRATE 20 MG PO TABS: ORAL_TABLET | ORAL | 0 refills | Status: DC

## 2018-02-01 MED ORDER — ESZOPICLONE 2 MG PO TABS: ORAL_TABLET | ORAL | 0 refills | Status: DC

## 2018-02-01 NOTE — Telephone Encounter (Signed)
Please advise on refill.

## 2018-02-03 ENCOUNTER — Other Ambulatory Visit: Payer: Self-pay

## 2018-02-03 ENCOUNTER — Telehealth: Payer: Self-pay | Admitting: Family Medicine

## 2018-02-03 DIAGNOSIS — G4700 Insomnia, unspecified: Secondary | ICD-10-CM

## 2018-02-03 MED ORDER — ESZOPICLONE 2 MG PO TABS: ORAL_TABLET | ORAL | 0 refills | Status: DC

## 2018-02-03 NOTE — Telephone Encounter (Signed)
Script faxed in

## 2018-02-03 NOTE — Telephone Encounter (Signed)
Per patient the pharmacy did not receive the Rx [facility]. Would like it to be resent or the pharmacy called.

## 2018-02-03 NOTE — Telephone Encounter (Signed)
Called patient let him know also made f/u app for July

## 2018-02-03 NOTE — Telephone Encounter (Signed)
Copied from CRM [id number]. Topic: Inquiry >> Feb 03, 2018  9:05 AM Yvonna Alanisobinson, Andra M wrote: Reason for CRM: Patient called requesting an Urgent Refill of his medications. Patient contacted our office on Monday by MyChart, but received no response. Patient wants eszopiclone Alfonso Patten(LUNESTA) 2 MG TABS tablet sent to [facility] [address] - [place], [place] - [id number] [phone] (Phone)  [phone] (Fax). Patient needs this one today as he is completely out and going on vacation.  Patient also needs Sildenafil (REVATIO) 20 MG tablet sent to  [facility] [address] [place], [place] - [address] [phone] (Phone) [phone] (Fax).  Thank You!!!

## 2018-02-04 ENCOUNTER — Other Ambulatory Visit: Payer: Self-pay | Admitting: Family Medicine

## 2018-02-04 DIAGNOSIS — N529 Male erectile dysfunction, unspecified: Secondary | ICD-10-CM

## 2018-02-08 ENCOUNTER — Other Ambulatory Visit: Payer: Self-pay | Admitting: Family Medicine

## 2018-02-08 DIAGNOSIS — N529 Male erectile dysfunction, unspecified: Secondary | ICD-10-CM

## 2018-02-08 NOTE — Telephone Encounter (Signed)
Please advise. Just filled 02/01/18

## 2018-02-08 NOTE — Telephone Encounter (Signed)
Call patient to clarify

## 2018-02-09 NOTE — Telephone Encounter (Signed)
Patient states he never received this on 6/12 because the pharmacy never actually received the request. He said he is going out of town tomorrow and needs sildenafil (REVATIO) 20 MG tablet [facility] [address]. Please advise.

## 2018-02-09 NOTE — Telephone Encounter (Signed)
See note

## 2018-02-11 ENCOUNTER — Other Ambulatory Visit: Payer: Self-pay | Admitting: Family Medicine

## 2018-02-11 DIAGNOSIS — I10 Essential (primary) hypertension: Secondary | ICD-10-CM

## 2018-02-25 NOTE — Progress Notes (Signed)
Louis Watson is a 62 y.o. male is here for follow up.  History of Present Illness:   HPI:   1. Obstructive sleep apnea syndrome. Having trouble getting CPAP supplies. Compliant.   2. Essential hypertension.   Review: taking medications as instructed, no medication side effects noted, no TIAs, no chest pain on exertion, no dyspnea on exertion, no swelling of ankles.  Smoker: No.  Wt Readings from Last 3 Encounters:  02/27/18 281 lb 9.6 oz (127.7 kg)  01/25/18 278 lb (126.1 kg)  10/25/17 279 lb (126.6 kg)   BP Readings from Last 3 Encounters:  02/27/18 110/70  01/25/18 126/82  10/25/17 124/82   Lab Results  Component Value Date   CREATININE 0.97 10/19/2017    3. Insomnia, unspecified type   4. Erectile dysfunction, unspecified erectile dysfunction type    There are no preventive care reminders to display for this patient. Depression screen [facility] 2/9 02/27/2018 01/11/2017  Decreased Interest 0 0  Down, Depressed, Hopeless 0 0  PHQ - 2 Score 0 0  Altered sleeping 0 -  Tired, decreased energy 0 -  Change in appetite 1 -  Feeling bad or failure about yourself  0 -  Trouble concentrating 0 -  Moving slowly or fidgety/restless 0 -  Suicidal thoughts 0 -  PHQ-9 Score 1 -  Difficult doing work/chores Not difficult at all -   PMHx, SurgHx, SocialHx, FamHx, Medications, and Allergies were reviewed in the Visit Navigator and updated as appropriate.   Patient Active Problem List   Diagnosis Date Noted  . Dizziness 10/25/2017  . Eczema 06/17/2017  . Partial tear of right subscapularis tendon 01/25/2017  . Hx of pancreatitis 01/17/2017  . Hx of lumbar discectomy 01/17/2017  . Diverticulosis 01/17/2017  . Erectile dysfunction 01/15/2017  . Chronic right shoulder pain 01/15/2017  . Bursitis of shoulder 09/10/2015  . Hypertension 10/29/2014  . Insomnia 10/29/2014  . Sleep apnea 10/29/2014   Social History   Tobacco Use  . Smoking status: Current Some Day Smoker   Types: Cigars  . Smokeless tobacco: Never Used  Substance Use Topics  . Alcohol use: No  . Drug use: No   Current Medications and Allergies:   Current Outpatient Medications:  .  amLODipine (NORVASC) 5 MG tablet, Take 1 tablet (5 mg total) by mouth daily., Disp: 90 tablet, Rfl: 3 .  aspirin EC 81 MG tablet, Take 81 mg by mouth daily., Disp: , Rfl:  .  augmented betamethasone dipropionate (DIPROLENE-AF) 0.05 % ointment, , Disp: , Rfl: 1 .  eszopiclone (LUNESTA) 2 MG TABS tablet, TAKE 1 TABLET BY MOUTH AT BEDTIME AS NEEDED FOR SLEEP *TAKE IMMEDIATELY BEFORE BED**, Disp: 30 tablet, Rfl: 5 .  sildenafil (REVATIO) 20 MG tablet, 5 po x 1, take 0.5 to 4 hours prior to sexual activity, Disp: 30 tablet, Rfl: 5 .  metFORMIN (GLUCOPHAGE XR) 500 MG 24 hr tablet, Take 2 tablets (1,000 mg total) by mouth daily with breakfast., Disp: 180 tablet, Rfl: 3   Allergies  Allergen Reactions  . Penicillins Rash   Review of Systems   Pertinent items are noted in the HPI. Otherwise, ROS is negative.  Vitals:   Vitals:   02/27/18 1547  BP: 110/70  Pulse: 62  Temp: 98.3 F (36.8 C)  TempSrc: Oral  SpO2: 96%  Weight: 281 lb 9.6 oz (127.7 kg)  Height: 5\' 10"  (1.778 m)     Body mass index is 40.41 kg/m.  Physical Exam:  Physical Exam  Constitutional: He is oriented to person, place, and time. He appears well-developed and well-nourished. No distress.  HENT:  Head: Normocephalic and atraumatic.  Right Ear: External ear normal.  Left Ear: External ear normal.  Nose: Nose normal.  Mouth/Throat: Oropharynx is clear and moist.  Eyes: Pupils are equal, round, and reactive to light. Conjunctivae and EOM are normal.  Neck: Normal range of motion. Neck supple.  Cardiovascular: Normal rate, regular rhythm, normal heart sounds and intact distal pulses.  Pulmonary/Chest: Effort normal and breath sounds normal.  Abdominal: Soft. Bowel sounds are normal.  Musculoskeletal: Normal range of motion.    Neurological: He is alert and oriented to person, place, and time.  Skin: Skin is warm and dry.  Psychiatric: He has a normal mood and affect. His behavior is normal. Judgment and thought content normal.  Nursing note and vitals reviewed.  Results for orders placed or performed in visit on 10/19/17  CBC with Differential/Platelet  Result Value Ref Range   WBC 8.5 4.0 - 10.5 K/uL   RBC 4.69 4.22 - 5.81 Mil/uL   Hemoglobin 15.8 13.0 - 17.0 g/dL   HCT 54.045.8 98.139.0 - 19.152.0 %   MCV 97.7 78.0 - 100.0 fl   MCHC 34.4 30.0 - 36.0 g/dL   RDW 47.812.6 29.511.5 - 62.115.5 %   Platelets 254.0 150.0 - 400.0 K/uL   Neutrophils Relative % 64.3 43.0 - 77.0 %   Lymphocytes Relative 26.4 12.0 - 46.0 %   Monocytes Relative 7.4 3.0 - 12.0 %   Eosinophils Relative 1.2 0.0 - 5.0 %   Basophils Relative 0.7 0.0 - 3.0 %   Neutro Abs 5.5 1.4 - 7.7 K/uL   Lymphs Abs 2.3 0.7 - 4.0 K/uL   Monocytes Absolute 0.6 0.1 - 1.0 K/uL   Eosinophils Absolute 0.1 0.0 - 0.7 K/uL   Basophils Absolute 0.1 0.0 - 0.1 K/uL  Comprehensive metabolic panel  Result Value Ref Range   Sodium 138 135 - 145 mEq/L   Potassium 4.4 3.5 - 5.1 mEq/L   Chloride 102 96 - 112 mEq/L   CO2 29 19 - 32 mEq/L   Glucose, Bld 94 70 - 99 mg/dL   BUN 18 6 - 23 mg/dL   Creatinine, Ser 3.080.97 0.40 - 1.50 mg/dL   Total Bilirubin 0.8 0.2 - 1.2 mg/dL   Alkaline Phosphatase 66 39 - 117 U/L   AST 21 0 - 37 U/L   ALT 20 0 - 53 U/L   Total Protein 6.8 6.0 - 8.3 g/dL   Albumin 3.9 3.5 - 5.2 g/dL   Calcium 9.4 8.4 - 65.710.5 mg/dL   GFR 84.6983.21 >62.95>60.00 mL/min  TSH  Result Value Ref Range   TSH 1.85 0.35 - 4.50 uIU/mL  Vitamin B12  Result Value Ref Range   Vitamin B-12 650 211 - 911 pg/mL    Assessment and Plan:   Sherrine MaplesGlenn was seen today for follow-up.  Diagnoses and all orders for this visit:  Obstructive sleep apnea syndrome Comments: Needs CPAP supplies.   Essential hypertension Comments: Refill of medications today. Orders: -     amLODipine (NORVASC) 5 MG  tablet; Take 1 tablet (5 mg total) by mouth daily.  Insomnia, unspecified type Comments: Refill of medications today, Orders: -     eszopiclone (LUNESTA) 2 MG TABS tablet; TAKE 1 TABLET BY MOUTH AT BEDTIME AS NEEDED FOR SLEEP *TAKE IMMEDIATELY BEFORE BED**  Erectile dysfunction, unspecified erectile dysfunction type Comments: Refill of Viagra today. Orders: -  sildenafil (REVATIO) 20 MG tablet; 5 po x 1, take 0.5 to 4 hours prior to sexual activity  Pre-diabetes Comments: Refill today. Orders: -     metFORMIN (GLUCOPHAGE XR) 500 MG 24 hr tablet; Take 2 tablets (1,000 mg total) by mouth daily with breakfast.  Obesity (BMI 30-39.9) Comments: Reviewed healthy food choices and exercise.    . Reviewed expectations re: course of current medical issues. . Discussed self-management of symptoms. . Outlined signs and symptoms indicating need for more acute intervention. . Patient verbalized understanding and all questions were answered. Marland Kitchen Health Maintenance issues including appropriate healthy diet, exercise, and smoking avoidance were discussed with patient. . See orders for this visit as documented in the electronic medical record. . Patient received an After Visit Summary.  Helane Rima, DO [place] [place] 03/01/2018  No future appointments.

## 2018-02-27 ENCOUNTER — Ambulatory Visit (INDEPENDENT_AMBULATORY_CARE_PROVIDER_SITE_OTHER): Payer: BLUE CROSS/BLUE SHIELD | Admitting: Family Medicine

## 2018-02-27 ENCOUNTER — Encounter: Payer: Self-pay | Admitting: Family Medicine

## 2018-02-27 VITALS — BP 110/70 | HR 62 | Temp 98.3°F | Ht 70.0 in | Wt 281.6 lb

## 2018-02-27 DIAGNOSIS — G4733 Obstructive sleep apnea (adult) (pediatric): Secondary | ICD-10-CM

## 2018-02-27 DIAGNOSIS — R7303 Prediabetes: Secondary | ICD-10-CM

## 2018-02-27 DIAGNOSIS — N529 Male erectile dysfunction, unspecified: Secondary | ICD-10-CM

## 2018-02-27 DIAGNOSIS — I10 Essential (primary) hypertension: Secondary | ICD-10-CM

## 2018-02-27 DIAGNOSIS — G4700 Insomnia, unspecified: Secondary | ICD-10-CM

## 2018-02-27 DIAGNOSIS — E669 Obesity, unspecified: Secondary | ICD-10-CM

## 2018-02-27 MED ORDER — METFORMIN HCL ER 500 MG PO TB24: 1000.0000 mg | ORAL_TABLET | Freq: Every day | ORAL | 3 refills | Status: DC

## 2018-02-27 MED ORDER — ESZOPICLONE 2 MG PO TABS: ORAL_TABLET | ORAL | 5 refills | Status: DC

## 2018-02-27 MED ORDER — AMLODIPINE BESYLATE 5 MG PO TABS: 5.0000 mg | ORAL_TABLET | Freq: Every day | ORAL | 3 refills | Status: DC

## 2018-02-27 MED ORDER — SILDENAFIL CITRATE 20 MG PO TABS: ORAL_TABLET | ORAL | 5 refills | Status: DC

## 2018-02-28 ENCOUNTER — Telehealth: Payer: Self-pay

## 2018-02-28 NOTE — Telephone Encounter (Signed)
Per office visit yesterday Patient had not heard about c pap supplies. After looking in chart [facility] required copy of sleep study. Per Phone note started on 2/19 he was going to bring to office. We have never received. I have called and l/m to call so that I can review with patient.

## 2018-03-01 ENCOUNTER — Encounter: Payer: Self-pay | Admitting: Family Medicine

## 2018-03-06 NOTE — Telephone Encounter (Signed)
I have the sleep study for him do you need new order or do I need to put one in?

## 2018-03-08 NOTE — Telephone Encounter (Signed)
It may be best for us to place a new order now for the sleep supplies.

## 2018-03-09 NOTE — Telephone Encounter (Signed)
Order faxed to [facility] with sleep study. Sleep study sent to scan

## 2018-03-13 ENCOUNTER — Telehealth: Payer: Self-pay | Admitting: Family Medicine

## 2018-03-13 NOTE — Telephone Encounter (Signed)
Copied from CRM [id number]. Topic: Quick Communication - See Telephone Encounter >> Mar 13, 2018  2:34 PM Louie BunPalacios Medina, Rosey Batheresa D wrote: CRM for notification. See Telephone encounter for: 03/13/18. Patient called and would like to talk to Dr. Earlene PlaterWallace about a trip to [place] and see what vaccine he needs and about his CPAP machine. Please call patient back, thanks.

## 2018-03-13 NOTE — Telephone Encounter (Signed)
Yes

## 2018-03-13 NOTE — Telephone Encounter (Signed)
Spoke with the patient and he is needing his last to Hep A/B injections for [place]. He has documentation of the first injection that will be faxed. Are you ok with this.   I have given the patient the [facility] number to call.

## 2018-03-14 NOTE — Telephone Encounter (Signed)
Scheduled patient for nurse visit

## 2018-03-29 DIAGNOSIS — H1851 Endothelial corneal dystrophy: Secondary | ICD-10-CM | POA: Diagnosis not present

## 2018-03-29 DIAGNOSIS — H5213 Myopia, bilateral: Secondary | ICD-10-CM | POA: Diagnosis not present

## 2018-03-29 DIAGNOSIS — H2513 Age-related nuclear cataract, bilateral: Secondary | ICD-10-CM | POA: Diagnosis not present

## 2018-03-31 ENCOUNTER — Ambulatory Visit (INDEPENDENT_AMBULATORY_CARE_PROVIDER_SITE_OTHER): Payer: BLUE CROSS/BLUE SHIELD | Admitting: Physician Assistant

## 2018-03-31 ENCOUNTER — Encounter: Payer: Self-pay | Admitting: Physician Assistant

## 2018-03-31 VITALS — BP 120/68 | HR 68 | Temp 98.7°F | Ht 70.0 in | Wt 278.4 lb

## 2018-03-31 DIAGNOSIS — M545 Low back pain, unspecified: Secondary | ICD-10-CM

## 2018-03-31 DIAGNOSIS — G8929 Other chronic pain: Secondary | ICD-10-CM

## 2018-03-31 MED ORDER — CYCLOBENZAPRINE HCL 5 MG PO TABS: 5.0000 mg | ORAL_TABLET | Freq: Three times a day (TID) | ORAL | 0 refills | Status: DC | PRN

## 2018-03-31 NOTE — Progress Notes (Signed)
Louis Watson is a 62 y.o. male here for a recurrent problem.  SCRIBE STATEMENT  History of Present Illness:   Chief Complaint  Patient presents with  . Back Pain    Back Pain  This is a recurrent (Hx of Lamenectomy 20 years ago, has not had problems in the past 15 years.) problem. The current episode started yesterday (Started at work, was at [employer] heating coffee and had a back spasm lower lumbar area. Pt is concerned because he is leaving for [place] tomorrow, 27 hour flight. If back pain worsens wants to make sure has something to take.). The problem occurs intermittently. The problem is unchanged. The pain is present in the lumbar spine. The quality of the pain is described as aching. The pain does not radiate. The pain is at a severity of 1/10. The pain is mild. The pain is the same all the time. The symptoms are aggravated by bending and twisting. Stiffness is present: None. Pertinent negatives include no abdominal pain, bladder incontinence, bowel incontinence, headaches, leg pain, numbness, tingling or weakness. He has tried NSAIDs for the symptoms. The treatment provided moderate relief.   Is going to [place], will be gone for 1 week.  In the past when he has had severe back spasms he has responded well to muscle relaxers and "high dose motrin. Denies any changes in bowel or bladder.   Past Medical History:  Diagnosis Date  . Diverticulosis 01/17/2017  . Hx of lumbar discectomy 01/17/2017  . Hx of pancreatitis 01/17/2017  . Hypertension   . Pancreatitis 2013  . Plantar fasciitis      Social History   Socioeconomic History  . Marital status: Married    Spouse name: Not on file  . Number of children: Not on file  . Years of education: Not on file  . Highest education level: Not on file  Occupational History  . Not on file  Social Needs  . Financial resource strain: Not on file  . Food insecurity:    Worry: Not on file    Inability: Not on file  . Transportation needs:    Medical: Not on file    Non-medical: Not on file  Tobacco Use  . Smoking status: Current Some Day Smoker    Types: Cigars  . Smokeless tobacco: Never Used  Substance and Sexual Activity  . Alcohol use: No  . Drug use: No  . Sexual activity: Not on file  Lifestyle  . Physical activity:    Days per week: Not on file    Minutes per session: Not on file  . Stress: Not on file  Relationships  . Social connections:    Talks on phone: Not on file    Gets together: Not on file    Attends religious service: Not on file    Active member of club or organization: Not on file    Attends meetings of clubs or organizations: Not on file    Relationship status: Not on file  . Intimate partner violence:    Fear of current or ex partner: Not on file    Emotionally abused: Not on file    Physically abused: Not on file    Forced sexual activity: Not on file  Other Topics Concern  . Not on file  Social History Narrative  . Not on file    Past Surgical History:  Procedure Laterality Date  . KNEE ARTHROSCOPY  2013  . LUMBAR DISC SURGERY  1993  Family History  Problem Relation Age of Onset  . Congestive Heart Failure Mother     Allergies  Allergen Reactions  . Penicillins Rash    Current Medications:   Current Outpatient Medications:  .  amLODipine (NORVASC) 5 MG tablet, Take 1 tablet (5 mg total) by mouth daily., Disp: 90 tablet, Rfl: 3 .  aspirin EC 81 MG tablet, Take 81 mg by mouth daily., Disp: , Rfl:  .  augmented betamethasone dipropionate (DIPROLENE-AF) 0.05 % ointment, , Disp: , Rfl: 1 .  eszopiclone (LUNESTA) 2 MG TABS tablet, TAKE 1 TABLET BY MOUTH AT BEDTIME AS NEEDED FOR SLEEP *TAKE IMMEDIATELY BEFORE BED**, Disp: 30 tablet, Rfl: 5 .  metFORMIN (GLUCOPHAGE XR) 500 MG 24 hr tablet, Take 2 tablets (1,000 mg total) by mouth daily with breakfast., Disp: 180 tablet, Rfl: 3 .  sildenafil (REVATIO) 20 MG tablet, 5 po x 1, take 0.5 to 4 hours prior to sexual activity, Disp: 30  tablet, Rfl: 5 .  azithromycin (ZITHROMAX) 500 MG tablet, TAKE 1 TABLET BY MOUTH ONCE DAILY FOR 3 DAYS, Disp: , Rfl: 0 .  cyclobenzaprine (FLEXERIL) 5 MG tablet, Take 1 tablet (5 mg total) by mouth 3 (three) times daily as needed for muscle spasms., Disp: 20 tablet, Rfl: 0 .  loperamide (IMODIUM) 2 MG capsule, TAKE ONE CAPSULE BY MOUTH 4 TIMES A DAY AS NEEDED FOR DIARRHEA, Disp: , Rfl: 0   Review of Systems:   Review of Systems  Gastrointestinal: Negative for abdominal pain and bowel incontinence.  Genitourinary: Negative for bladder incontinence.  Musculoskeletal: Positive for back pain.  Neurological: Negative for tingling, weakness, numbness and headaches.    Vitals:   Vitals:   03/31/18 0949  BP: 120/68  Pulse: 68  Temp: 98.7 F (37.1 C)  TempSrc: Oral  SpO2: 95%  Weight: 278 lb 6.1 oz (126.3 kg)  Height: 5\' 10"  (1.778 m)     Body mass index is 39.94 kg/m.  Physical Exam:   Physical Exam  Constitutional: He appears well-developed. He is cooperative.  Non-toxic appearance. He does not have a sickly appearance. He does not appear ill. No distress.  Cardiovascular: Normal rate, regular rhythm, S1 normal, S2 normal, normal heart sounds and normal pulses.  No LE edema  Pulmonary/Chest: Effort normal and breath sounds normal.  Musculoskeletal:  No decreased ROM 2/2 pain with flexion/extension, lateral side bends, or rotation. Reproducible tenderness with deep palpation to left lumbar paraspinal muscles. No bony tenderness. No evidence of erythema, rash or ecchymosis.    Neurological: He is alert. GCS eye subscore is 4. GCS verbal subscore is 5. GCS motor subscore is 6.  Skin: Skin is warm, dry and intact.  Psychiatric: He has a normal mood and affect. His speech is normal and behavior is normal.  Nursing note and vitals reviewed.   Assessment and Plan:    Louis Watson was seen today for back pain.  Diagnoses and all orders for this visit:  Chronic left-sided low back pain  without sciatica  Other orders -     cyclobenzaprine (FLEXERIL) 5 MG tablet; Take 1 tablet (5 mg total) by mouth 3 (three) times daily as needed for muscle spasms.   No red flags on exam today.  We discussed interventions, patient would like to have a muscle relaxer on hand that he can take with him to [place] should he need it and I have sent in [facility] for him.  We discussed pain management options and he will just continue to  take his Motrin.  Follow-up with [place] if symptoms persist after he returns from trip.  Patient is agreeable to plan.   . Reviewed expectations re: course of current medical issues. . Discussed self-management of symptoms. . Outlined signs and symptoms indicating need for more acute intervention. . Patient verbalized understanding and all questions were answered. . See orders for this visit as documented in the electronic medical record. . Patient received an After-Visit Summary.   CMA or LPN served as scribe during this visit. History, Physical, and Plan performed by medical provider. Documentation and orders reviewed and attested to.   Jarold Motto, PA-C

## 2018-03-31 NOTE — Patient Instructions (Signed)
It was great to see you!  I have sent in the muscle relaxer for you. Let us know if you need anything else.

## 2018-04-11 ENCOUNTER — Ambulatory Visit (INDEPENDENT_AMBULATORY_CARE_PROVIDER_SITE_OTHER): Payer: BLUE CROSS/BLUE SHIELD

## 2018-04-11 ENCOUNTER — Ambulatory Visit: Payer: BLUE CROSS/BLUE SHIELD | Admitting: Family Medicine

## 2018-04-11 ENCOUNTER — Ambulatory Visit: Payer: BLUE CROSS/BLUE SHIELD

## 2018-04-11 DIAGNOSIS — Z23 Encounter for immunization: Secondary | ICD-10-CM | POA: Diagnosis not present

## 2018-04-11 NOTE — Progress Notes (Deleted)
Louis Watson is a 62 y.o. male here for an acute visit.  History of Present Illness:   {CMA SCRIBE ATTESTATION}  HPI:   PMHx, SurgHx, SocialHx, Medications, and Allergies were reviewed in the Visit Navigator and updated as appropriate.  Current Medications:   Current Outpatient Medications:  .  amLODipine (NORVASC) 5 MG tablet, Take 1 tablet (5 mg total) by mouth daily., Disp: 90 tablet, Rfl: 3 .  aspirin EC 81 MG tablet, Take 81 mg by mouth daily., Disp: , Rfl:  .  augmented betamethasone dipropionate (DIPROLENE-AF) 0.05 % ointment, , Disp: , Rfl: 1 .  azithromycin (ZITHROMAX) 500 MG tablet, TAKE 1 TABLET BY MOUTH ONCE DAILY FOR 3 DAYS, Disp: , Rfl: 0 .  cyclobenzaprine (FLEXERIL) 5 MG tablet, Take 1 tablet (5 mg total) by mouth 3 (three) times daily as needed for muscle spasms., Disp: 20 tablet, Rfl: 0 .  eszopiclone (LUNESTA) 2 MG TABS tablet, TAKE 1 TABLET BY MOUTH AT BEDTIME AS NEEDED FOR SLEEP *TAKE IMMEDIATELY BEFORE BED**, Disp: 30 tablet, Rfl: 5 .  loperamide (IMODIUM) 2 MG capsule, TAKE ONE CAPSULE BY MOUTH 4 TIMES A DAY AS NEEDED FOR DIARRHEA, Disp: , Rfl: 0 .  metFORMIN (GLUCOPHAGE XR) 500 MG 24 hr tablet, Take 2 tablets (1,000 mg total) by mouth daily with breakfast., Disp: 180 tablet, Rfl: 3 .  sildenafil (REVATIO) 20 MG tablet, 5 po x 1, take 0.5 to 4 hours prior to sexual activity, Disp: 30 tablet, Rfl: 5   Allergies  Allergen Reactions  . Penicillins Rash   Review of Systems:   Pertinent items are noted in the HPI. Otherwise, ROS is negative.  Vitals:  There were no vitals filed for this visit.   There is no height or weight on file to calculate BMI.  Physical Exam:   Physical Exam  Results for orders placed or performed in visit on 10/19/17  CBC with Differential/Platelet  Result Value Ref Range   WBC 8.5 4.0 - 10.5 K/uL   RBC 4.69 4.22 - 5.81 Mil/uL   Hemoglobin 15.8 13.0 - 17.0 g/dL   HCT 29.545.8 62.139.0 - 30.852.0 %   MCV 97.7 78.0 - 100.0 fl   MCHC 34.4  30.0 - 36.0 g/dL   RDW 65.712.6 84.611.5 - 96.215.5 %   Platelets 254.0 150.0 - 400.0 K/uL   Neutrophils Relative % 64.3 43.0 - 77.0 %   Lymphocytes Relative 26.4 12.0 - 46.0 %   Monocytes Relative 7.4 3.0 - 12.0 %   Eosinophils Relative 1.2 0.0 - 5.0 %   Basophils Relative 0.7 0.0 - 3.0 %   Neutro Abs 5.5 1.4 - 7.7 K/uL   Lymphs Abs 2.3 0.7 - 4.0 K/uL   Monocytes Absolute 0.6 0.1 - 1.0 K/uL   Eosinophils Absolute 0.1 0.0 - 0.7 K/uL   Basophils Absolute 0.1 0.0 - 0.1 K/uL  Comprehensive metabolic panel  Result Value Ref Range   Sodium 138 135 - 145 mEq/L   Potassium 4.4 3.5 - 5.1 mEq/L   Chloride 102 96 - 112 mEq/L   CO2 29 19 - 32 mEq/L   Glucose, Bld 94 70 - 99 mg/dL   BUN 18 6 - 23 mg/dL   Creatinine, Ser 9.520.97 0.40 - 1.50 mg/dL   Total Bilirubin 0.8 0.2 - 1.2 mg/dL   Alkaline Phosphatase 66 39 - 117 U/L   AST 21 0 - 37 U/L   ALT 20 0 - 53 U/L   Total Protein 6.8 6.0 -  8.3 g/dL   Albumin 3.9 3.5 - 5.2 g/dL   Calcium 9.4 8.4 - 72.510.5 mg/dL   GFR 36.6483.21 >40.34>60.00 mL/min  TSH  Result Value Ref Range   TSH 1.85 0.35 - 4.50 uIU/mL  Vitamin B12  Result Value Ref Range   Vitamin B-12 650 211 - 911 pg/mL    Assessment and Plan:   There are no diagnoses linked to this encounter.  . Reviewed expectations re: course of current medical issues. . Discussed self-management of symptoms. . Outlined signs and symptoms indicating need for more acute intervention. . Patient verbalized understanding and all questions were answered. Marland Kitchen. Health Maintenance issues including appropriate healthy diet, exercise, and smoking avoidance were discussed with patient. . See orders for this visit as documented in the electronic medical record. . Patient received an After Visit Summary.  *** CMA served as Neurosurgeonscribe during this visit. History, Physical, and Plan performed by medical provider. The above documentation has been reviewed and is accurate and complete. Helane RimaErica Khadijah Mastrianni, D.O.  Helane RimaErica Aixa Corsello, DO [place] 04/11/2018

## 2018-04-11 NOTE — Progress Notes (Signed)
Patient here for his Hep A/B vaccine. Administered in patient's left deltoid by Marlowe AschoffAria Samyuktha Brau, CMA. Patient tolerated great, and no reaction from vaccine. Patient will return in 5 months for part 3 of his vaccines.

## 2018-04-19 ENCOUNTER — Encounter: Payer: Self-pay | Admitting: Physician Assistant

## 2018-04-19 ENCOUNTER — Ambulatory Visit (INDEPENDENT_AMBULATORY_CARE_PROVIDER_SITE_OTHER): Payer: BLUE CROSS/BLUE SHIELD | Admitting: Physician Assistant

## 2018-04-19 VITALS — BP 120/62 | HR 66 | Temp 98.7°F | Ht 70.5 in | Wt 274.0 lb

## 2018-04-19 DIAGNOSIS — R197 Diarrhea, unspecified: Secondary | ICD-10-CM | POA: Diagnosis not present

## 2018-04-19 LAB — CBC WITH DIFFERENTIAL/PLATELET
BASOS PCT: 0.6 % (ref 0.0–3.0)
Basophils Absolute: 0 10*3/uL (ref 0.0–0.1)
Eosinophils Absolute: 0.1 10*3/uL (ref 0.0–0.7)
Eosinophils Relative: 1.5 % (ref 0.0–5.0)
HEMATOCRIT: 45.9 % (ref 39.0–52.0)
Hemoglobin: 15.6 g/dL (ref 13.0–17.0)
LYMPHS PCT: 25.3 % (ref 12.0–46.0)
Lymphs Abs: 1.9 10*3/uL (ref 0.7–4.0)
MCHC: 34 g/dL (ref 30.0–36.0)
MCV: 96.7 fl (ref 78.0–100.0)
MONOS PCT: 10.6 % (ref 3.0–12.0)
Monocytes Absolute: 0.8 10*3/uL (ref 0.1–1.0)
NEUTROS ABS: 4.6 10*3/uL (ref 1.4–7.7)
NEUTROS PCT: 62 % (ref 43.0–77.0)
PLATELETS: 233 10*3/uL (ref 150.0–400.0)
RBC: 4.75 Mil/uL (ref 4.22–5.81)
RDW: 13.1 % (ref 11.5–15.5)
WBC: 7.5 10*3/uL (ref 4.0–10.5)

## 2018-04-19 LAB — COMPREHENSIVE METABOLIC PANEL WITH GFR
ALT: 31 U/L (ref 0–53)
AST: 28 U/L (ref 0–37)
Albumin: 4.2 g/dL (ref 3.5–5.2)
Alkaline Phosphatase: 65 U/L (ref 39–117)
BUN: 18 mg/dL (ref 6–23)
CO2: 27 meq/L (ref 19–32)
Calcium: 9.6 mg/dL (ref 8.4–10.5)
Chloride: 102 meq/L (ref 96–112)
Creatinine, Ser: 1.07 mg/dL (ref 0.40–1.50)
GFR: 74.19 mL/min
Glucose, Bld: 90 mg/dL (ref 70–99)
Potassium: 4.5 meq/L (ref 3.5–5.1)
Sodium: 137 meq/L (ref 135–145)
Total Bilirubin: 0.9 mg/dL (ref 0.2–1.2)
Total Protein: 7.5 g/dL (ref 6.0–8.3)

## 2018-04-19 LAB — LIPASE: Lipase: 39 U/L (ref 11.0–59.0)

## 2018-04-19 NOTE — Patient Instructions (Signed)
Please increase your fluid intake. Hydration is key!  Get plenty of rest.   If you feel up to eating solid foods, try to follow the dietary guide at the bottom of your After Visit Summary. These foods are gentler on the stomach.  Start a daily probiotic. Some examples include Align, Digestive Advantage, Culturelle.   We will contact you about your stool studies and lab results.  If you are unable to keep fluids down by mouth, or if symptoms acutely worsen, please go to the ER as you may need IV fluids.    Food Choices to Help Relieve Diarrhea, Adult When you have diarrhea, the foods you eat and your eating habits are very important. Choosing the right foods and drinks can help relieve diarrhea. Also, because diarrhea can last up to 7 days, you need to replace lost fluids and electrolytes (such as sodium, potassium, and chloride) in order to help prevent dehydration.  WHAT GENERAL GUIDELINES DO I NEED TO FOLLOW?  Slowly drink 1 cup (8 oz) of fluid for each episode of diarrhea. If you are getting enough fluid, your urine will be clear or pale yellow.  Eat starchy foods. Some good choices include white rice, white toast, pasta, low-fiber cereal, baked potatoes (without the skin), saltine crackers, and bagels.  Avoid large servings of any cooked vegetables.  Limit fruit to two servings per day. A serving is  cup or 1 small piece.  Choose foods with less than 2 g of fiber per serving.  Limit fats to less than 8 tsp (38 g) per day.  Avoid fried foods.  Eat foods that have probiotics in them. Probiotics can be found in certain dairy products.  Avoid foods and beverages that may increase the speed at which food moves through the stomach and intestines (gastrointestinal tract). Things to avoid include:  High-fiber foods, such as dried fruit, raw fruits and vegetables, nuts, seeds, and whole grain foods.  Spicy foods and high-fat foods.  Foods and beverages sweetened with high-fructose  corn syrup, honey, or sugar alcohols such as xylitol, sorbitol, and mannitol. WHAT FOODS ARE RECOMMENDED? Grains White rice. White, [place], or pita breads (fresh or toasted), including plain rolls, buns, or bagels. White pasta. Saltine, soda, or graham crackers. Pretzels. Low-fiber cereal. Cooked cereals made with water (such as cornmeal, farina, or cream cereals). Plain muffins. Matzo. Melba toast. Zwieback.  Vegetables Potatoes (without the skin). Strained tomato and vegetable juices. Most well-cooked and canned vegetables without seeds. Tender lettuce. Fruits Cooked or canned applesauce, apricots, cherries, fruit cocktail, grapefruit, peaches, pears, or plums. Fresh bananas, apples without skin, cherries, grapes, cantaloupe, grapefruit, peaches, oranges, or plums.  Meat and Other Protein Products Baked or boiled chicken. Eggs. Tofu. Fish. Seafood. Smooth peanut butter. Ground or well-cooked tender beef, ham, veal, lamb, pork, or poultry.  Dairy Plain yogurt, kefir, and unsweetened liquid yogurt. Lactose-free milk, buttermilk, or soy milk. Plain hard cheese. Beverages Sport drinks. Clear broths. Diluted fruit juices (except prune). Regular, caffeine-free sodas such as ginger ale. Water. Decaffeinated teas. Oral rehydration solutions. Sugar-free beverages not sweetened with sugar alcohols. Other Bouillon, broth, or soups made from recommended foods.  The items listed above may not be a complete list of recommended foods or beverages. Contact your dietitian for more options. WHAT FOODS ARE NOT RECOMMENDED? Grains Whole grain, whole wheat, bran, or rye breads, rolls, pastas, crackers, and cereals. Wild or brown rice. Cereals that contain more than 2 g of fiber per serving. Corn tortillas or taco shells.  Cooked or dry oatmeal. Granola. Popcorn. Vegetables Raw vegetables. Cabbage, broccoli, Brussels sprouts, artichokes, baked beans, beet greens, corn, kale, legumes, peas, sweet potatoes, and yams.  Potato skins. Cooked spinach and cabbage. Fruits Dried fruit, including raisins and dates. Raw fruits. Stewed or dried prunes. Fresh apples with skin, apricots, mangoes, pears, raspberries, and strawberries.  Meat and Other Protein Products Chunky peanut butter. Nuts and seeds. Beans and lentils. Tomasa Blase.  Dairy High-fat cheeses. Milk, chocolate milk, and beverages made with milk, such as milk shakes. Cream. Ice cream. Sweets and Desserts Sweet rolls, doughnuts, and sweet breads. Pancakes and waffles. Fats and Oils Butter. Cream sauces. Margarine. Salad oils. Plain salad dressings. Olives. Avocados.  Beverages Caffeinated beverages (such as coffee, tea, soda, or energy drinks). Alcoholic beverages. Fruit juices with pulp. Prune juice. Soft drinks sweetened with high-fructose corn syrup or sugar alcohols. Other Coconut. Hot sauce. Chili powder. Mayonnaise. Gravy. Cream-based or milk-based soups.  The items listed above may not be a complete list of foods and beverages to avoid. Contact your dietitian for more information. WHAT SHOULD I DO IF I BECOME DEHYDRATED? Diarrhea can sometimes lead to dehydration. Signs of dehydration include dark urine and dry mouth and skin. If you think you are dehydrated, you should rehydrate with an oral rehydration solution. These solutions can be purchased at pharmacies, retail stores, or online.  Drink -1 cup (120-240 mL) of oral rehydration solution each time you have an episode of diarrhea. If drinking this amount makes your diarrhea worse, try drinking smaller amounts more often. For example, drink 1-3 tsp (5-15 mL) every 5-10 minutes.  A general rule for staying hydrated is to drink 1-2 L of fluid per day. Talk to your health care provider about the specific amount you should be drinking each day. Drink enough fluids to keep your urine clear or pale yellow.   This information is not intended to replace advice given to you by your health care provider. Make  sure you discuss any questions you have with your health care provider.   Document Released: 10/30/2003 Document Revised: 08/30/2014 Document Reviewed: 07/02/2013 Elsevier Interactive Patient Education [employer]

## 2018-04-19 NOTE — Progress Notes (Signed)
Louis Watson is a 62 y.o. male here for an acute visit.  History of Present Illness:   Louis Watson, CMA acting as scribe for Louis Motto PA   HPI: Patient went on trip to [place]. The day he came home started having some diarrhea. It can go from loose stools to watery. At first was not every day but for the last 8 days it has been every day. He can have between 2-3 bowel movements in the morning between 8-10am. This morning was a little more formed but still loose. He did take Azithromycin 500 mg for three days last week to see if he would have any improvement from it -- was prescribed by a healthcare provider at work. Patient has had some abdominal pain before has bowel movement but goes away after. He denies any nausea or vomiting, fever or any other symptoms. He has had normal appetite and eating normal foods. Having significant urgency. Having Type 6 and 7 Bristol stools. Eating normal foods. Drinking plenty of water.  Denies blood in stools. Has not taken any immodium. Does state that he had a similar episode like this when he traveled to [place] in the past, required stool cultures and antibiotic. Denies urinary symptoms. Feels well hydrated.   PMHx, SurgHx, SocialHx, Medications, and Allergies were reviewed in the Visit Navigator and updated as appropriate.  Current Medications:   Current Outpatient Medications:  .  amLODipine (NORVASC) 5 MG tablet, Take 1 tablet (5 mg total) by mouth daily., Disp: 90 tablet, Rfl: 3 .  aspirin EC 81 MG tablet, Take 81 mg by mouth daily., Disp: , Rfl:  .  augmented betamethasone dipropionate (DIPROLENE-AF) 0.05 % ointment, , Disp: , Rfl: 1 .  azithromycin (ZITHROMAX) 500 MG tablet, TAKE 1 TABLET BY MOUTH ONCE DAILY FOR 3 DAYS, Disp: , Rfl: 0 .  cyclobenzaprine (FLEXERIL) 5 MG tablet, Take 1 tablet (5 mg total) by mouth 3 (three) times daily as needed for muscle spasms., Disp: 20 tablet, Rfl: 0 .  eszopiclone (LUNESTA) 2 MG TABS tablet, TAKE 1  TABLET BY MOUTH AT BEDTIME AS NEEDED FOR SLEEP *TAKE IMMEDIATELY BEFORE BED**, Disp: 30 tablet, Rfl: 5 .  loperamide (IMODIUM) 2 MG capsule, TAKE ONE CAPSULE BY MOUTH 4 TIMES A DAY AS NEEDED FOR DIARRHEA, Disp: , Rfl: 0 .  metFORMIN (GLUCOPHAGE XR) 500 MG 24 hr tablet, Take 2 tablets (1,000 mg total) by mouth daily with breakfast., Disp: 180 tablet, Rfl: 3 .  sildenafil (REVATIO) 20 MG tablet, 5 po x 1, take 0.5 to 4 hours prior to sexual activity, Disp: 30 tablet, Rfl: 5   Allergies  Allergen Reactions  . Penicillins Rash   Review of Systems:   Pertinent items are noted in the HPI. Otherwise, ROS is negative.  Vitals:   Vitals:   04/19/18 1131  BP: 120/62  Pulse: 66  Temp: 98.7 F (37.1 C)  TempSrc: Oral  SpO2: 95%  Weight: 274 lb (124.3 kg)  Height: 5' 10.5" (1.791 m)     Body mass index is 38.76 kg/m.  Physical Exam:   Physical Exam   Results for orders placed or performed in visit on 10/19/17  CBC with Differential/Platelet  Result Value Ref Range   WBC 8.5 4.0 - 10.5 K/uL   RBC 4.69 4.22 - 5.81 Mil/uL   Hemoglobin 15.8 13.0 - 17.0 g/dL   HCT 81.1 91.4 - 78.2 %   MCV 97.7 78.0 - 100.0 fl   MCHC 34.4 30.0 - 36.0  g/dL   RDW 16.112.6 09.611.5 - 04.515.5 %   Platelets 254.0 150.0 - 400.0 K/uL   Neutrophils Relative % 64.3 43.0 - 77.0 %   Lymphocytes Relative 26.4 12.0 - 46.0 %   Monocytes Relative 7.4 3.0 - 12.0 %   Eosinophils Relative 1.2 0.0 - 5.0 %   Basophils Relative 0.7 0.0 - 3.0 %   Neutro Abs 5.5 1.4 - 7.7 K/uL   Lymphs Abs 2.3 0.7 - 4.0 K/uL   Monocytes Absolute 0.6 0.1 - 1.0 K/uL   Eosinophils Absolute 0.1 0.0 - 0.7 K/uL   Basophils Absolute 0.1 0.0 - 0.1 K/uL  Comprehensive metabolic panel  Result Value Ref Range   Sodium 138 135 - 145 mEq/L   Potassium 4.4 3.5 - 5.1 mEq/L   Chloride 102 96 - 112 mEq/L   CO2 29 19 - 32 mEq/L   Glucose, Bld 94 70 - 99 mg/dL   BUN 18 6 - 23 mg/dL   Creatinine, Ser 4.090.97 0.40 - 1.50 mg/dL   Total Bilirubin 0.8 0.2 - 1.2  mg/dL   Alkaline Phosphatase 66 39 - 117 U/L   AST 21 0 - 37 U/L   ALT 20 0 - 53 U/L   Total Protein 6.8 6.0 - 8.3 g/dL   Albumin 3.9 3.5 - 5.2 g/dL   Calcium 9.4 8.4 - 81.110.5 mg/dL   GFR 91.4783.21 >82.95>60.00 mL/min  TSH  Result Value Ref Range   TSH 1.85 0.35 - 4.50 uIU/mL  Vitamin B12  Result Value Ref Range   Vitamin B-12 650 211 - 911 pg/mL    Assessment and Plan:   Sherrine MaplesGlenn was seen today for diarrhea.  Diagnoses and all orders for this visit:  Diarrhea of presumed infectious origin -     Clostridium Difficile by PCR; Future -     Ova and parasite examination; Future -     Cancel: Stool culture; Future -     Gastrointestinal Pathogen Panel PCR; Future -     CBC with Differential/Platelet -     Lipase -     Comprehensive metabolic panel   No red flags on exam.  Vitals stable. Will order studies at this time as well as labs. Discussed starting probiotic and Imodium as needed. Provided list of red flags should symptoms worsen. Push fluids and rest. I recommend that patient follow-up if symptoms worsen or persist despite treatment. We will follow-up on any interventions based on labs and stool studies. Patient verbalized understanding to plan.  . Reviewed expectations re: course of current medical issues. . Discussed self-management of symptoms. . Outlined signs and symptoms indicating need for more acute intervention. . Patient verbalized understanding and all questions were answered. Marland Kitchen. Health Maintenance issues including appropriate healthy diet, exercise, and smoking avoidance were discussed with patient. . See orders for this visit as documented in the electronic medical record. . Patient received an After Visit Summary.  CMA served as Neurosurgeonscribe during this visit. History, Physical, and Plan performed by medical provider. The above documentation has been reviewed and is accurate and complete. Helane RimaErica Wallace, D.O.  Louis MottoSamantha Merita Hawks, PA [facility] 04/19/2018

## 2018-04-22 DIAGNOSIS — G4733 Obstructive sleep apnea (adult) (pediatric): Secondary | ICD-10-CM | POA: Diagnosis not present

## 2018-05-01 ENCOUNTER — Emergency Department (HOSPITAL_COMMUNITY): Payer: BLUE CROSS/BLUE SHIELD

## 2018-05-01 ENCOUNTER — Other Ambulatory Visit: Payer: Self-pay

## 2018-05-01 ENCOUNTER — Observation Stay (HOSPITAL_COMMUNITY)
Admission: EM | Admit: 2018-05-01 | Discharge: 2018-05-04 | DRG: 872 | Disposition: A | Discharge status: Home / Self Care | Payer: BLUE CROSS/BLUE SHIELD | Attending: Internal Medicine | Admitting: Internal Medicine

## 2018-05-01 ENCOUNTER — Encounter (HOSPITAL_COMMUNITY): Payer: Self-pay | Admitting: Radiology

## 2018-05-01 DIAGNOSIS — Z7984 Long term (current) use of oral hypoglycemic drugs: Secondary | ICD-10-CM

## 2018-05-01 DIAGNOSIS — Z880 Allergy status to penicillin: Secondary | ICD-10-CM | POA: Diagnosis not present

## 2018-05-01 DIAGNOSIS — R509 Fever, unspecified: Secondary | ICD-10-CM | POA: Diagnosis not present

## 2018-05-01 DIAGNOSIS — L03116 Cellulitis of left lower limb: Secondary | ICD-10-CM | POA: Diagnosis not present

## 2018-05-01 DIAGNOSIS — Z7982 Long term (current) use of aspirin: Secondary | ICD-10-CM | POA: Diagnosis not present

## 2018-05-01 DIAGNOSIS — R51 Headache: Secondary | ICD-10-CM | POA: Diagnosis not present

## 2018-05-01 DIAGNOSIS — L02612 Cutaneous abscess of left foot: Secondary | ICD-10-CM | POA: Diagnosis not present

## 2018-05-01 DIAGNOSIS — I340 Nonrheumatic mitral (valve) insufficiency: Secondary | ICD-10-CM | POA: Diagnosis not present

## 2018-05-01 DIAGNOSIS — Z6841 Body Mass Index (BMI) 40.0 and over, adult: Secondary | ICD-10-CM

## 2018-05-01 DIAGNOSIS — I10 Essential (primary) hypertension: Secondary | ICD-10-CM | POA: Diagnosis present

## 2018-05-01 DIAGNOSIS — K029 Dental caries, unspecified: Secondary | ICD-10-CM | POA: Diagnosis not present

## 2018-05-01 DIAGNOSIS — N281 Cyst of kidney, acquired: Secondary | ICD-10-CM | POA: Diagnosis not present

## 2018-05-01 DIAGNOSIS — E1169 Type 2 diabetes mellitus with other specified complication: Secondary | ICD-10-CM

## 2018-05-01 DIAGNOSIS — R109 Unspecified abdominal pain: Secondary | ICD-10-CM | POA: Diagnosis not present

## 2018-05-01 DIAGNOSIS — R21 Rash and other nonspecific skin eruption: Secondary | ICD-10-CM | POA: Diagnosis not present

## 2018-05-01 DIAGNOSIS — I959 Hypotension, unspecified: Secondary | ICD-10-CM | POA: Diagnosis not present

## 2018-05-01 DIAGNOSIS — A419 Sepsis, unspecified organism: Principal | ICD-10-CM | POA: Diagnosis present

## 2018-05-01 DIAGNOSIS — E119 Type 2 diabetes mellitus without complications: Secondary | ICD-10-CM | POA: Diagnosis not present

## 2018-05-01 DIAGNOSIS — E669 Obesity, unspecified: Secondary | ICD-10-CM | POA: Diagnosis not present

## 2018-05-01 DIAGNOSIS — L03119 Cellulitis of unspecified part of limb: Secondary | ICD-10-CM | POA: Diagnosis not present

## 2018-05-01 DIAGNOSIS — G4733 Obstructive sleep apnea (adult) (pediatric): Secondary | ICD-10-CM | POA: Diagnosis present

## 2018-05-01 DIAGNOSIS — L02619 Cutaneous abscess of unspecified foot: Secondary | ICD-10-CM | POA: Diagnosis not present

## 2018-05-01 DIAGNOSIS — Z79899 Other long term (current) drug therapy: Secondary | ICD-10-CM | POA: Diagnosis not present

## 2018-05-01 DIAGNOSIS — F17290 Nicotine dependence, other tobacco product, uncomplicated: Secondary | ICD-10-CM | POA: Diagnosis present

## 2018-05-01 DIAGNOSIS — G4730 Sleep apnea, unspecified: Secondary | ICD-10-CM | POA: Diagnosis not present

## 2018-05-01 DIAGNOSIS — K5730 Diverticulosis of large intestine without perforation or abscess without bleeding: Secondary | ICD-10-CM | POA: Diagnosis not present

## 2018-05-01 DIAGNOSIS — R42 Dizziness and giddiness: Secondary | ICD-10-CM | POA: Diagnosis not present

## 2018-05-01 DIAGNOSIS — R112 Nausea with vomiting, unspecified: Secondary | ICD-10-CM | POA: Diagnosis not present

## 2018-05-01 DIAGNOSIS — R1110 Vomiting, unspecified: Secondary | ICD-10-CM | POA: Diagnosis not present

## 2018-05-01 DIAGNOSIS — R350 Frequency of micturition: Secondary | ICD-10-CM | POA: Diagnosis not present

## 2018-05-01 HISTORY — DX: Dependence on other enabling machines and devices: Z99.89

## 2018-05-01 HISTORY — DX: Obstructive sleep apnea (adult) (pediatric): G47.33

## 2018-05-01 LAB — CBC
HEMATOCRIT: 49 % (ref 39.0–52.0)
HEMOGLOBIN: 16.2 g/dL (ref 13.0–17.0)
MCH: 32.6 pg (ref 26.0–34.0)
MCHC: 33.1 g/dL (ref 30.0–36.0)
MCV: 98.6 fL (ref 78.0–100.0)
Platelets: 230 10*3/uL (ref 150–400)
RBC: 4.97 MIL/uL (ref 4.22–5.81)
RDW: 11.9 % (ref 11.5–15.5)
WBC: 13.6 10*3/uL — AB (ref 4.0–10.5)

## 2018-05-01 LAB — URINALYSIS, ROUTINE W REFLEX MICROSCOPIC
BACTERIA UA: NONE SEEN
Bilirubin Urine: NEGATIVE
Glucose, UA: NEGATIVE mg/dL
Ketones, ur: NEGATIVE mg/dL
LEUKOCYTES UA: NEGATIVE
Nitrite: NEGATIVE
PROTEIN: NEGATIVE mg/dL
SPECIFIC GRAVITY, URINE: 1.015 (ref 1.005–1.030)
pH: 5 (ref 5.0–8.0)

## 2018-05-01 LAB — COMPREHENSIVE METABOLIC PANEL
ALK PHOS: 64 U/L (ref 38–126)
ALT: 33 U/L (ref 0–44)
ANION GAP: 11 (ref 5–15)
AST: 30 U/L (ref 15–41)
Albumin: 3.9 g/dL (ref 3.5–5.0)
BUN: 17 mg/dL (ref 8–23)
CHLORIDE: 103 mmol/L (ref 98–111)
CO2: 27 mmol/L (ref 22–32)
Calcium: 9.2 mg/dL (ref 8.9–10.3)
Creatinine, Ser: 1.15 mg/dL (ref 0.61–1.24)
GFR calc non Af Amer: >60 mL/min (ref >60)
Glucose, Bld: 100 mg/dL — ABNORMAL HIGH (ref 70–99)
POTASSIUM: 3.8 mmol/L (ref 3.5–5.1)
Sodium: 141 mmol/L (ref 135–145)
Total Bilirubin: 1 mg/dL (ref 0.3–1.2)
Total Protein: 7.1 g/dL (ref 6.5–8.1)

## 2018-05-01 LAB — LACTIC ACID, PLASMA
LACTIC ACID, VENOUS: 1.4 mmol/L (ref 0.5–1.9)
Lactic Acid, Venous: 2.8 mmol/L (ref 0.5–1.9)

## 2018-05-01 LAB — I-STAT CG4 LACTIC ACID, ED
LACTIC ACID, VENOUS: 2.2 mmol/L — AB (ref 0.5–1.9)
Lactic Acid, Venous: 2.34 mmol/L (ref 0.5–1.9)

## 2018-05-01 LAB — I-STAT TROPONIN, ED: TROPONIN I, POC: 0 ng/mL (ref 0.00–0.08)

## 2018-05-01 LAB — LACTATE DEHYDROGENASE: LDH: 148 U/L (ref 98–192)

## 2018-05-01 LAB — LIPASE, BLOOD: Lipase: 44 U/L (ref 11–51)

## 2018-05-01 LAB — PROCALCITONIN: Procalcitonin: 2.33 ng/mL

## 2018-05-01 LAB — GLUCOSE, CAPILLARY
GLUCOSE-CAPILLARY: 92 mg/dL (ref 70–99)
Glucose-Capillary: 75 mg/dL (ref 70–99)

## 2018-05-01 LAB — D-DIMER, QUANTITATIVE: D-Dimer, Quant: 1.49 ug/mL-FEU — ABNORMAL HIGH (ref 0.00–0.50)

## 2018-05-01 LAB — PARASITE EXAM SCREEN, BLOOD-W CONF TO LABCORP (NOT @ ARMC)

## 2018-05-01 LAB — HEMOGLOBIN A1C
Hgb A1c MFr Bld: 5.8 % — ABNORMAL HIGH (ref 4.8–5.6)
Mean Plasma Glucose: 119.76 mg/dL

## 2018-05-01 MED ORDER — ZOLPIDEM TARTRATE 5 MG PO TABS
5.0000 mg | ORAL_TABLET | Freq: Every evening | ORAL | Status: DC | PRN
  Administered 2018-05-01 – 2018-05-03 (×3): 5 mg via ORAL
  Filled 2018-05-01 (×3): qty 1

## 2018-05-01 MED ORDER — ASPIRIN EC 81 MG PO TBEC
81.0000 mg | DELAYED_RELEASE_TABLET | Freq: Every day | ORAL | Status: DC
  Administered 2018-05-01 – 2018-05-04 (×4): 81 mg via ORAL
  Filled 2018-05-01 (×5): qty 1

## 2018-05-01 MED ORDER — SODIUM CHLORIDE 0.9 % IV BOLUS (SEPSIS)
1000.0000 mL | Freq: Once | INTRAVENOUS | Status: AC
  Administered 2018-05-01: 1000 mL via INTRAVENOUS

## 2018-05-01 MED ORDER — VANCOMYCIN HCL 10 G IV SOLR
2000.0000 mg | Freq: Once | INTRAVENOUS | Status: AC
  Administered 2018-05-01: 2000 mg via INTRAVENOUS
  Filled 2018-05-01: qty 2000

## 2018-05-01 MED ORDER — VANCOMYCIN HCL IN DEXTROSE 1-5 GM/200ML-% IV SOLN: 1000.0000 mg | Freq: Once | INTRAVENOUS | Status: DC

## 2018-05-01 MED ORDER — SODIUM CHLORIDE 0.9 % IV SOLN
1.0000 g | Freq: Three times a day (TID) | INTRAVENOUS | Status: DC
  Administered 2018-05-01 – 2018-05-03 (×5): 1 g via INTRAVENOUS
  Filled 2018-05-01 (×6): qty 1

## 2018-05-01 MED ORDER — VANCOMYCIN HCL IN DEXTROSE 1-5 GM/200ML-% IV SOLN
1000.0000 mg | Freq: Two times a day (BID) | INTRAVENOUS | Status: DC
  Filled 2018-05-01: qty 200

## 2018-05-01 MED ORDER — METRONIDAZOLE IN NACL 5-0.79 MG/ML-% IV SOLN
500.0000 mg | Freq: Three times a day (TID) | INTRAVENOUS | Status: DC
  Administered 2018-05-01: 500 mg via INTRAVENOUS
  Filled 2018-05-01: qty 100

## 2018-05-01 MED ORDER — SODIUM CHLORIDE 0.9 % IV SOLN
2.0000 g | Freq: Once | INTRAVENOUS | Status: AC
  Administered 2018-05-01: 2 g via INTRAVENOUS
  Filled 2018-05-01: qty 2

## 2018-05-01 MED ORDER — ONDANSETRON HCL 4 MG PO TABS
4.0000 mg | ORAL_TABLET | Freq: Four times a day (QID) | ORAL | Status: DC | PRN
  Administered 2018-05-03: 4 mg via ORAL
  Filled 2018-05-01: qty 1

## 2018-05-01 MED ORDER — FAMOTIDINE IN NACL 20-0.9 MG/50ML-% IV SOLN
20.0000 mg | Freq: Once | INTRAVENOUS | Status: AC
  Administered 2018-05-01: 20 mg via INTRAVENOUS
  Filled 2018-05-01: qty 50

## 2018-05-01 MED ORDER — LACTATED RINGERS IV BOLUS: 500.0000 mL | Freq: Once | INTRAVENOUS | Status: DC

## 2018-05-01 MED ORDER — SODIUM CHLORIDE 0.9 % IV BOLUS
1000.0000 mL | Freq: Once | INTRAVENOUS | Status: AC
  Administered 2018-05-01: 1000 mL via INTRAVENOUS

## 2018-05-01 MED ORDER — LACTATED RINGERS IV SOLN
INTRAVENOUS | Status: DC
  Administered 2018-05-01 – 2018-05-02 (×3): via INTRAVENOUS

## 2018-05-01 MED ORDER — SODIUM CHLORIDE 0.9 % IV BOLUS
500.0000 mL | Freq: Once | INTRAVENOUS | Status: AC
  Administered 2018-05-01: 500 mL via INTRAVENOUS

## 2018-05-01 MED ORDER — INSULIN ASPART 100 UNIT/ML ~~LOC~~ SOLN: 0.0000 [IU] | Freq: Three times a day (TID) | SUBCUTANEOUS | Status: DC

## 2018-05-01 MED ORDER — SODIUM CHLORIDE 0.9% FLUSH
3.0000 mL | Freq: Two times a day (BID) | INTRAVENOUS | Status: DC
  Administered 2018-05-01 – 2018-05-04 (×4): 3 mL via INTRAVENOUS

## 2018-05-01 MED ORDER — SODIUM CHLORIDE 0.9 % IV SOLN
1.0000 g | Freq: Three times a day (TID) | INTRAVENOUS | Status: DC
  Administered 2018-05-01: 1 g via INTRAVENOUS
  Filled 2018-05-01 (×2): qty 1

## 2018-05-01 MED ORDER — ENOXAPARIN SODIUM 40 MG/0.4ML ~~LOC~~ SOLN
40.0000 mg | SUBCUTANEOUS | Status: DC
  Administered 2018-05-01 – 2018-05-03 (×3): 40 mg via SUBCUTANEOUS
  Filled 2018-05-01 (×3): qty 0.4

## 2018-05-01 MED ORDER — ACETAMINOPHEN 500 MG PO TABS
1000.0000 mg | ORAL_TABLET | Freq: Four times a day (QID) | ORAL | Status: DC | PRN
  Administered 2018-05-01 – 2018-05-03 (×3): 1000 mg via ORAL
  Filled 2018-05-01 (×3): qty 2

## 2018-05-01 MED ORDER — IOPAMIDOL (ISOVUE-370) INJECTION 76%
INTRAVENOUS | Status: AC
  Filled 2018-05-01: qty 100

## 2018-05-01 MED ORDER — IOPAMIDOL (ISOVUE-370) INJECTION 76%
100.0000 mL | Freq: Once | INTRAVENOUS | Status: AC
  Administered 2018-05-01: 100 mL via INTRAVENOUS

## 2018-05-01 MED ORDER — IOPAMIDOL (ISOVUE-300) INJECTION 61%
INTRAVENOUS | Status: AC
  Filled 2018-05-01: qty 100

## 2018-05-01 MED ORDER — ONDANSETRON HCL 4 MG/2ML IJ SOLN
4.0000 mg | Freq: Four times a day (QID) | INTRAMUSCULAR | Status: DC | PRN
  Administered 2018-05-01: 4 mg via INTRAVENOUS
  Filled 2018-05-01: qty 2

## 2018-05-01 MED ORDER — INSULIN ASPART 100 UNIT/ML ~~LOC~~ SOLN: 0.0000 [IU] | Freq: Every day | SUBCUTANEOUS | Status: DC

## 2018-05-01 MED ORDER — ACETAMINOPHEN 325 MG PO TABS
325.0000 mg | ORAL_TABLET | Freq: Once | ORAL | Status: AC
  Administered 2018-05-01: 325 mg via ORAL
  Filled 2018-05-01: qty 1

## 2018-05-01 MED ORDER — ACETAMINOPHEN 325 MG PO TABS
650.0000 mg | ORAL_TABLET | Freq: Once | ORAL | Status: AC
  Administered 2018-05-01: 650 mg via ORAL
  Filled 2018-05-01: qty 2

## 2018-05-01 NOTE — Progress Notes (Signed)
Pt set up on CPAP 8.6OEH21 nasal mask tolerating well.  RT will continue to monitor.

## 2018-05-01 NOTE — H&P (Signed)
History and Physical    Louis Watson WUJ:[id number] DOB: [birth year] DOA: 05/01/2018  PCP: Helane Rima, DO Consultants:  None Patient coming from:  Home - lives with wife; NOK: Wife, [phone]  Chief Complaint:  chills  HPI: Louis Watson is a 62 y.o. male with medical history significant of HTN; DM; and OSA presenting with chills.  It started with chills about 2AM - it lasted for about an hour.  The got here about 3AM.  He spiked a fever to 103.  About 2 hours ago, the chills normalized - but he developed chills again recently.  No cough.  +SOB with chills.  He has been nauseated.  +headache.  No urinary symptoms.  He was in [place] about 3-4 weeks ago.  He had all his vaccines and took malaria and typhoid prophylaxis. He returned and 1 day later he developed diarrhea and abdominal pain without n/v.  Symptoms lasted about 4 days.  He called his NP at work - he was given a "disease pack and Lomotil" prior to going but did not take it.  She recommended that he take a Z-pack, which he finished.  His PCP then suggested stool samples and take Lomotil - but the Lomotil ended his diarrhea and he never had a stool sample.  He has not had any further diarrhea.  He thinks he was fine after that other than "odd aches and pains in my digestive system."     ED Course:  Fever, hypotension.  He was in [place] recently.  Returned with a diarrheal illness.  Saw PCP on 8/28 and given Imodium with improvement in diarrhea.  Last night with acute chills, fever 102.  No source.  +leukocytosis.  PCCM consulted but did not think patient needs ICU.  CT C/A/P negative.  Review of Systems: As per HPI; otherwise review of systems reviewed and negative.   Ambulatory Status:  Ambulates without assistance  Past Medical History:  Diagnosis Date  . Diverticulosis 01/17/2017  . Hx of lumbar discectomy 01/17/2017  . Hypertension   . OSA on CPAP    setting = 8  . Pancreatitis 2013  . Plantar fasciitis     Past Surgical  History:  Procedure Laterality Date  . KNEE ARTHROSCOPY  2013  . LUMBAR DISC SURGERY  1993    Social History   Socioeconomic History  . Marital status: Married    Spouse name: Not on file  . Number of children: Not on file  . Years of education: Not on file  . Highest education level: Not on file  Occupational History  . Occupation: Proofreader  Social Needs  . Financial resource strain: Not on file  . Food insecurity:    Worry: Not on file    Inability: Not on file  . Transportation needs:    Medical: Not on file    Non-medical: Not on file  Tobacco Use  . Smoking status: Current Every Day Smoker    Types: Cigars  . Smokeless tobacco: Never Used  Substance and Sexual Activity  . Alcohol use: No  . Drug use: No  . Sexual activity: Not on file  Lifestyle  . Physical activity:    Days per week: Not on file    Minutes per session: Not on file  . Stress: Not on file  Relationships  . Social connections:    Talks on phone: Not on file    Gets together: Not on file    Attends religious service: Not on  file    Active member of club or organization: Not on file    Attends meetings of clubs or organizations: Not on file    Relationship status: Not on file  . Intimate partner violence:    Fear of current or ex partner: Not on file    Emotionally abused: Not on file    Physically abused: Not on file    Forced sexual activity: Not on file  Other Topics Concern  . Not on file  Social History Narrative  . Not on file    Allergies  Allergen Reactions  . Penicillins Rash    Family History  Problem Relation Age of Onset  . Congestive Heart Failure Mother     Prior to Admission medications   Medication Sig Start Date End Date Taking? Authorizing Provider  amLODipine (NORVASC) 5 MG tablet Take 1 tablet (5 mg total) by mouth daily. 02/27/18  Yes Helane Rima, DO  aspirin EC 81 MG tablet Take 81 mg by mouth daily.   Yes [provider]  augmented  betamethasone dipropionate (DIPROLENE-AF) 0.05 % ointment Apply 1 application topically daily as needed (rash).  06/14/17  Yes [provider]  eszopiclone (LUNESTA) 2 MG TABS tablet TAKE 1 TABLET BY MOUTH AT BEDTIME AS NEEDED FOR SLEEP *TAKE IMMEDIATELY BEFORE BED** Patient taking differently: Take 2 mg by mouth at bedtime as needed for sleep.  02/27/18  Yes Helane Rima, DO  loperamide (IMODIUM) 2 MG capsule Take 2 mg by mouth 4 (four) times daily as needed for diarrhea or loose stools.  03/09/18  Yes [provider]  metFORMIN (GLUCOPHAGE XR) 500 MG 24 hr tablet Take 2 tablets (1,000 mg total) by mouth daily with breakfast. 02/27/18 05/28/18 Yes Helane Rima, DO  sildenafil (REVATIO) 20 MG tablet 5 po x 1, take 0.5 to 4 hours prior to sexual activity Patient taking differently: Take 20 mg by mouth as needed. 4 hours prior to sexual activity 02/27/18  Yes Helane Rima, DO  cyclobenzaprine (FLEXERIL) 5 MG tablet Take 1 tablet (5 mg total) by mouth 3 (three) times daily as needed for muscle spasms. Patient not taking: Reported on 05/01/2018 03/31/18   Jarold Motto, PA    Physical Exam: Vitals:   05/01/18 1500 05/01/18 1530 05/01/18 1553 05/01/18 1600  BP: 112/64 114/62 106/60 119/64  Pulse: 73 76 77 76  Resp: (!) 28 20 16    Temp:   98.6 F (37 C)   TempSrc:   Oral   SpO2: 98% 99% 97% 97%  Weight:      Height:         General:  Appears calm and comfortable and is NAD but he is mildly flushed and may be once again developing fever Eyes:  PERRL, EOMI, normal lids, iris ENT:  grossly normal hearing, lips & tongue, mmm; appropriate dentition Neck:  no LAD, masses or thyromegaly; no carotid bruits Cardiovascular:  RRR, no m/r/g. No LE edema.  Respiratory:   CTA bilaterally with no wheezes/rales/rhonchi.  Normal respiratory effort. Abdomen:  soft, NT, ND, NABS Back:   normal alignment, no CVAT Skin:  no rash or induration seen on limited exam Musculoskeletal:  grossly  normal tone BUE/BLE, good ROM, no bony abnormality Psychiatric:  grossly normal mood and affect, speech fluent and appropriate, AOx3 Neurologic:  CN 2-12 grossly intact, moves all extremities in coordinated fashion, sensation intact    Radiological Exams on Admission: Dg Chest 2 View  Result Date: 05/01/2018 CLINICAL DATA:  Fever and  chills. EXAM: CHEST - 2 VIEW COMPARISON:  None. FINDINGS: The cardiomediastinal contours are normal. The lungs are clear. Pulmonary vasculature is normal. No consolidation, pleural effusion, or pneumothorax. No acute osseous abnormalities are seen. IMPRESSION: Clear lungs. Electronically Signed   By: Narda Rutherford M.D.   On: 05/01/2018 04:39   Ct Angio Chest Pe W/cm &/or Wo Cm  Result Date: 05/01/2018 CLINICAL DATA:  Abdominal pain and diarrhea EXAM: CT ANGIOGRAPHY CHEST CT ABDOMEN AND PELVIS WITH CONTRAST TECHNIQUE: Multidetector CT imaging of the chest was performed using the standard protocol during bolus administration of intravenous contrast. Multiplanar CT image reconstructions and MIPs were obtained to evaluate the vascular anatomy. Multidetector CT imaging of the abdomen and pelvis was performed using the standard protocol during bolus administration of intravenous contrast. CONTRAST:  100 mL ISOVUE-370 IOPAMIDOL (ISOVUE-370) INJECTION 76% COMPARISON:  Chest x-ray from earlier in the same day. FINDINGS: CTA CHEST FINDINGS Cardiovascular: Mild atherosclerotic calcifications of the thoracic aorta are noted. No aneurysmal dilatation or dissection is seen. Mild coronary calcifications are noted. No significant cardiac enlargement is seen. The pulmonary artery is well visualized centrally although the timing of the contrast bolus is somewhat limited for peripheral embolus evaluation. No large central pulmonary embolus is seen. Mediastinum/Nodes: Thoracic inlet is within normal limits. No sizable hilar or mediastinal adenopathy is noted. The esophagus is within normal  limits. Lungs/Pleura: The lungs are well aerated bilaterally without focal infiltrate or sizable effusion. Musculoskeletal: Degenerative changes of the thoracic spine are noted. No acute bony abnormality is seen. Review of the MIP images confirms the above findings. CT ABDOMEN and PELVIS FINDINGS Hepatobiliary: No focal liver abnormality is seen. No gallstones, gallbladder wall thickening, or biliary dilatation. Pancreas: Unremarkable. No pancreatic ductal dilatation or surrounding inflammatory changes. Spleen: Normal in size without focal abnormality. Adrenals/Urinary Tract: Adrenal glands are within normal limits. Kidneys are well visualized bilaterally. Left renal cyst is noted in the lower pole measuring 3.4 cm. No renal calculi or obstructive changes are seen. The ureters are within normal limits. The bladder is partially distended. Stomach/Bowel: Scattered diverticular change of the colon is noted without evidence of diverticulitis. No obstructive or inflammatory changes are seen. The appendix is within normal limits. No specific small-bowel abnormality is noted. Vascular/Lymphatic: Aortic atherosclerosis. No enlarged abdominal or pelvic lymph nodes. Reproductive: Prostate is unremarkable. Other: No abdominal wall hernia or abnormality. No abdominopelvic ascites. Musculoskeletal: No acute or significant osseous findings. Chronic T11 compression deformity is noted. Review of the MIP images confirms the above findings. IMPRESSION: CT of the chest: No evidence of central pulmonary embolism. No acute abnormality in the lungs is noted. CT of the abdomen and pelvis: Diverticulosis without evidence of diverticulitis. Left renal cyst. Chronic changes as described above. Electronically Signed   By: Alcide Clever M.D.   On: 05/01/2018 11:51   Ct Abdomen Pelvis W Contrast  Result Date: 05/01/2018 CLINICAL DATA:  Abdominal pain and diarrhea EXAM: CT ANGIOGRAPHY CHEST CT ABDOMEN AND PELVIS WITH CONTRAST TECHNIQUE:  Multidetector CT imaging of the chest was performed using the standard protocol during bolus administration of intravenous contrast. Multiplanar CT image reconstructions and MIPs were obtained to evaluate the vascular anatomy. Multidetector CT imaging of the abdomen and pelvis was performed using the standard protocol during bolus administration of intravenous contrast. CONTRAST:  100 mL ISOVUE-370 IOPAMIDOL (ISOVUE-370) INJECTION 76% COMPARISON:  Chest x-ray from earlier in the same day. FINDINGS: CTA CHEST FINDINGS Cardiovascular: Mild atherosclerotic calcifications of the thoracic aorta are noted. No aneurysmal  dilatation or dissection is seen. Mild coronary calcifications are noted. No significant cardiac enlargement is seen. The pulmonary artery is well visualized centrally although the timing of the contrast bolus is somewhat limited for peripheral embolus evaluation. No large central pulmonary embolus is seen. Mediastinum/Nodes: Thoracic inlet is within normal limits. No sizable hilar or mediastinal adenopathy is noted. The esophagus is within normal limits. Lungs/Pleura: The lungs are well aerated bilaterally without focal infiltrate or sizable effusion. Musculoskeletal: Degenerative changes of the thoracic spine are noted. No acute bony abnormality is seen. Review of the MIP images confirms the above findings. CT ABDOMEN and PELVIS FINDINGS Hepatobiliary: No focal liver abnormality is seen. No gallstones, gallbladder wall thickening, or biliary dilatation. Pancreas: Unremarkable. No pancreatic ductal dilatation or surrounding inflammatory changes. Spleen: Normal in size without focal abnormality. Adrenals/Urinary Tract: Adrenal glands are within normal limits. Kidneys are well visualized bilaterally. Left renal cyst is noted in the lower pole measuring 3.4 cm. No renal calculi or obstructive changes are seen. The ureters are within normal limits. The bladder is partially distended. Stomach/Bowel: Scattered  diverticular change of the colon is noted without evidence of diverticulitis. No obstructive or inflammatory changes are seen. The appendix is within normal limits. No specific small-bowel abnormality is noted. Vascular/Lymphatic: Aortic atherosclerosis. No enlarged abdominal or pelvic lymph nodes. Reproductive: Prostate is unremarkable. Other: No abdominal wall hernia or abnormality. No abdominopelvic ascites. Musculoskeletal: No acute or significant osseous findings. Chronic T11 compression deformity is noted. Review of the MIP images confirms the above findings. IMPRESSION: CT of the chest: No evidence of central pulmonary embolism. No acute abnormality in the lungs is noted. CT of the abdomen and pelvis: Diverticulosis without evidence of diverticulitis. Left renal cyst. Chronic changes as described above. Electronically Signed   By: Alcide Clever M.D.   On: 05/01/2018 11:51    EKG: Independently reviewed.  NSR with rate 96; nonspecific ST changes with no evidence of acute ischemia   Labs on Admission: I have personally reviewed the available labs and imaging studies at the time of the admission.  Pertinent labs:   Essentially normal CMP Troponin 0.00 WBC 13.6, CBC otherwise WNL Lactate 2.34, 2.20 UA: small hgb, otherwise WNL  Assessment/Plan Principal Problem:   Sepsis (HCC) Active Problems:   Hypertension   Sleep apnea   Sepsis -SIRS criteria in this patient includes: Leukocytosis, fever, tachypnea -Patient has evidence of acute organ failure with elevated lactate and borderline hypotension -While awaiting blood cultures, this appears to be a preseptic condition. -Sepsis protocol initiated -Patient had initial lactate >4 or SBP <90/MAP <65 and so has received the 30 cc/kg IVF bolus. -Uncertain source - this is complicated by his fairly recent foreign travel to [place] -Blood and urine cultures pending -Will admit with telemetry and continue to monitor -Treat with IV Vanc/Zosyn for  undifferentiated sepsis -Will add HIV; malaria testing; parasite exam; and typhoid testing -Will trend lactate to ensure improvement -Will order lower respiratory tract procalcitonin level.  Antibiotics would not be indicated for PCT <0.1 and probably should not be used for < 0.25.  >0.5 indicates infection and >>0.5 indicates more serious disease.  As the procalcitonin level normalizes, it will be reasonable to consider de-escalation of antibiotic coverage. -Will consult ID.  HTN -Continue Norvasc  DM -Will check A1c -hold Glucophage -Cover with moderate-scale SSI  OSA -Continue CPAP   DVT prophylaxis: Lovenox  Code Status:  Full - confirmed with patient/family Family Communication: Wife present throughout evaluation  Disposition Plan:  Home once clinically improved Consults called: ID  Admission status: Admit - It is my clinical opinion that admission to INPATIENT is reasonable and necessary because of the expectation that this patient will require hospital care that crosses at least 2 midnights to treat this condition based on the medical complexity of the problems presented.  Given the aforementioned information, the predictability of an adverse outcome is felt to be significant.     Jonah Blue MD [facility]  If note is complete, please contact covering daytime or nighttime physician. [email or web address] Password [facility]  05/01/2018, 4:35 PM

## 2018-05-01 NOTE — ED Notes (Signed)
Pt complains of chills and abd pains.

## 2018-05-01 NOTE — ED Provider Notes (Addendum)
[facility] EMERGENCY DEPARTMENT Provider Note   CSN: [id number] Arrival date & time: 05/01/18  0320     History   Chief Complaint Chief Complaint  Patient presents with  . Chills    HPI Louis Watson is a 62 y.o. male with a past medical history of diverticulosis, HTN, who presents to ED for evaluation of chills, subjective fever, chest and abdominal discomfort that woke him up from sleep approximately 3 hours ago. States that he had chills for about 30 min continuous. Home temperature readings were normal. Reports having chest and abdominal discomfort when having the chills which he describes as "everything kinda hurts." He has not taken any antipyretics prior to arrival. Endorses nausea but no vomiting. Patient returned from 1 week trip to [place] 3 weeks ago. Once he returned, he developed diarrhea. PCP rx azithromycin which he took. He reports improvement in diarrhea with this and imodium x1 dose. He has been having normal bowel movements since. Reports getting all prophylactic vaccines prior to traveling, including hepatitis, malaria.  Denies urinary symptoms, prior MI or PE, sick contacts, rashes, tick bites, leg swelling, recent surgeries, hormone use, alcohol/drug use. Denies chronic NSAID use. Denies prior abdominal surgeries.  HPI  Past Medical History:  Diagnosis Date  . Diverticulosis 01/17/2017  . Hx of lumbar discectomy 01/17/2017  . Hx of pancreatitis 01/17/2017  . Hypertension   . Pancreatitis 2013  . Plantar fasciitis     Patient Active Problem List   Diagnosis Date Noted  . Dizziness 10/25/2017  . Eczema 06/17/2017  . Partial tear of right subscapularis tendon 01/25/2017  . Hx of pancreatitis 01/17/2017  . Hx of lumbar discectomy 01/17/2017  . Diverticulosis 01/17/2017  . Erectile dysfunction 01/15/2017  . Chronic right shoulder pain 01/15/2017  . Bursitis of shoulder 09/10/2015  . Hypertension 10/29/2014  . Insomnia 10/29/2014  . Sleep  apnea 10/29/2014    Past Surgical History:  Procedure Laterality Date  . KNEE ARTHROSCOPY  2013  . LUMBAR DISC SURGERY  1993        Home Medications    Prior to Admission medications   Medication Sig Start Date End Date Taking? Authorizing Provider  amLODipine (NORVASC) 5 MG tablet Take 1 tablet (5 mg total) by mouth daily. 02/27/18   Helane Rima, DO  aspirin EC 81 MG tablet Take 81 mg by mouth daily.    [provider]  augmented betamethasone dipropionate (DIPROLENE-AF) 0.05 % ointment  06/14/17   [provider]  azithromycin (ZITHROMAX) 500 MG tablet TAKE 1 TABLET BY MOUTH ONCE DAILY FOR 3 DAYS 03/09/18   [provider]  cyclobenzaprine (FLEXERIL) 5 MG tablet Take 1 tablet (5 mg total) by mouth 3 (three) times daily as needed for muscle spasms. 03/31/18   Jarold Motto, PA  eszopiclone (LUNESTA) 2 MG TABS tablet TAKE 1 TABLET BY MOUTH AT BEDTIME AS NEEDED FOR SLEEP *TAKE IMMEDIATELY BEFORE BED** 02/27/18   Helane Rima, DO  loperamide (IMODIUM) 2 MG capsule TAKE ONE CAPSULE BY MOUTH 4 TIMES A DAY AS NEEDED FOR DIARRHEA 03/09/18   [provider]  metFORMIN (GLUCOPHAGE XR) 500 MG 24 hr tablet Take 2 tablets (1,000 mg total) by mouth daily with breakfast. 02/27/18 05/28/18  Helane Rima, DO  sildenafil (REVATIO) 20 MG tablet 5 po x 1, take 0.5 to 4 hours prior to sexual activity 02/27/18   Helane Rima, DO    Family History Family History  Problem Relation Age of Onset  . Congestive  Heart Failure Mother     Social History Social History   Tobacco Use  . Smoking status: Current Some Day Smoker    Types: Cigars  . Smokeless tobacco: Never Used  Substance Use Topics  . Alcohol use: No  . Drug use: No     Allergies   Penicillins   Review of Systems Review of Systems  Constitutional: Positive for chills and fever. Negative for appetite change.  HENT: Negative for ear pain, rhinorrhea, sneezing and sore throat.   Eyes: Negative for  photophobia and visual disturbance.  Respiratory: Negative for cough, chest tightness, shortness of breath and wheezing.   Cardiovascular: Positive for chest pain. Negative for palpitations.  Gastrointestinal: Positive for abdominal pain and diarrhea. Negative for blood in stool, constipation, nausea and vomiting.  Genitourinary: Negative for dysuria, hematuria and urgency.  Musculoskeletal: Negative for myalgias.  Skin: Negative for rash.  Neurological: Negative for dizziness, weakness and light-headedness.     Physical Exam Updated Vital Signs BP 100/64   Pulse 93   Temp (!) 102.3 F (39.1 C) (Oral)   Resp (!) 31   Wt 124.7 kg   SpO2 93%   BMI 38.90 kg/m   Physical Exam  Constitutional: He appears well-developed and well-nourished. No distress.  HENT:  Head: Normocephalic and atraumatic.  Nose: Nose normal.  Eyes: Conjunctivae and EOM are normal. Left eye exhibits no discharge. No scleral icterus.  Neck: Normal range of motion. Neck supple.  Cardiovascular: Normal rate, regular rhythm, normal heart sounds and intact distal pulses. Exam reveals no gallop and no friction rub.  No murmur heard. Pulmonary/Chest: Effort normal and breath sounds normal. No respiratory distress.  Abdominal: Soft. Bowel sounds are normal. He exhibits no distension. There is no tenderness. There is no rebound and no guarding.    Musculoskeletal: Normal range of motion. He exhibits no edema.  No lower extremity edema, erythema or calf tenderness bilaterally.  Neurological: He is alert. He exhibits normal muscle tone. Coordination normal.  Skin: Skin is warm and dry. No rash noted.  Psychiatric: He has a normal mood and affect.  Nursing note and vitals reviewed.    ED Treatments / Results  Labs (all labs ordered are listed, but only abnormal results are displayed) Labs Reviewed  COMPREHENSIVE METABOLIC PANEL - Abnormal; Notable for the following components:      Result Value   Glucose, Bld  100 (*)    All other components within normal limits  CBC - Abnormal; Notable for the following components:   WBC 13.6 (*)    All other components within normal limits  URINALYSIS, ROUTINE W REFLEX MICROSCOPIC - Abnormal; Notable for the following components:   Hgb urine dipstick SMALL (*)    All other components within normal limits  CULTURE, BLOOD (ROUTINE X 2)  CULTURE, BLOOD (ROUTINE X 2)  LIPASE, BLOOD  D-DIMER, QUANTITATIVE (NOT AT [facility])  PARASITE EXAM SCREEN, BLOOD-W CONF TO [facility] (NOT @ [facility])  I-STAT TROPONIN, ED  I-STAT CG4 LACTIC ACID, ED    EKG EKG Interpretation  Date/Time:  Monday May 01 2018 03:24:54 EDT Ventricular Rate:  96 PR Interval:  178 QRS Duration: 98 QT Interval:  320 QTC Calculation: 404 R Axis:   28 Text Interpretation:  Normal sinus rhythm Septal infarct , age undetermined Abnormal ECG No old tracing to compare Confirmed by Rochele Raring [phone]) on 05/01/2018 5:52:39 AM Also confirmed by Ward, Baxter Hire [phone]), editor Jac Canavan, [place] ([id number])  on 05/01/2018 6:54:18 AM   Radiology  Dg Chest 2 View  Result Date: 05/01/2018 CLINICAL DATA:  Fever and chills. EXAM: CHEST - 2 VIEW COMPARISON:  None. FINDINGS: The cardiomediastinal contours are normal. The lungs are clear. Pulmonary vasculature is normal. No consolidation, pleural effusion, or pneumothorax. No acute osseous abnormalities are seen. IMPRESSION: Clear lungs. Electronically Signed   By: Narda Rutherford M.D.   On: 05/01/2018 04:39    Procedures Procedures (including critical care time)  CRITICAL CARE Performed by: Dietrich Pates   Total critical care time: 35 minutes  Critical care time was exclusive of separately billable procedures and treating other patients.  Critical care was necessary to treat or prevent imminent or life-threatening deterioration.  Critical care was time spent personally by me on the following activities: development of treatment plan with patient and/or  surrogate as well as nursing, discussions with consultants, evaluation of patient's response to treatment, examination of patient, obtaining history from patient or surrogate, ordering and performing treatments and interventions, ordering and review of laboratory studies, ordering and review of radiographic studies, pulse oximetry and re-evaluation of patient's condition.   Medications Ordered in ED Medications  aztreonam (AZACTAM) 2 g in sodium chloride 0.9 % 100 mL IVPB (has no administration in time range)  metroNIDAZOLE (FLAGYL) IVPB 500 mg (has no administration in time range)  acetaminophen (TYLENOL) tablet 325 mg (has no administration in time range)  vancomycin (VANCOCIN) 2,000 mg in sodium chloride 0.9 % 500 mL IVPB (has no administration in time range)  acetaminophen (TYLENOL) tablet 650 mg (650 mg Oral Given 05/01/18 0542)  sodium chloride 0.9 % bolus 1,000 mL (0 mLs Intravenous Stopped 05/01/18 0653)  famotidine (PEPCID) IVPB 20 mg premix (0 mg Intravenous Stopped 05/01/18 0654)     Initial Impression / Assessment and Plan / ED Course  I have reviewed the triage vital signs and the nursing notes.  Pertinent labs & imaging results that were available during my care of the patient were reviewed by me and considered in my medical decision making (see chart for details).  Clinical Course as of May 01 709  Mon May 01, 2018  0709 Temp: 99 F (37.2 C) [HK]  0709 Temp(!): 102.3 F (39.1 C) [HK]    Clinical Course User Index [HK] Dietrich Pates, [place]    62 year old male with past medical history of diverticulosis, hypertension presents to ED for evaluation of chills, subjective fever, chest and abdominal discomfort that woke him up from sleep approximately 3 hours ago.  States that he had chills for about 30 minutes continuously.  Home temperature readings were normal.  He reports having chest and abdominal discomfort but states that "everything kind of hurts."  Did not take any  medications or antipyretics prior to arrival.  Endorses nausea but no vomiting.  Of note, patient returned from a 1 week trip to [place] approximately 3 weeks ago.  He developed diarrhea when he arrived back to the [place].  Primary care provider prescribed azithromycin which he took.  He reports improvement in his diarrhea with this and Imodium.  He has been having normal bowel movement since then.  On exam there is tenderness palpation of the right side of the abdomen without rebound or guarding noted.  He is febrile to 100.4 and 101.7 on my reevaluation.  Patient given Tylenol, fluid bolus.  Lab work significant for leukocytosis at 13.6.  Troponin unremarkable.  Lipase, CMP and urinalysis unremarkable.  Chest x-ray is negative for acute abnormality.  EKG shows normal sinus rhythm with no prior  tracings to compare. Patient with oxygen saturations in 91 to 94% on room air during my second evaluation with good waveform.  Due to his age, hypoxia, recent travel on 30-hour plane ride, will need to obtain d-dimer to rule out PE.  If this is negative we will need to proceed with CT of the abdomen pelvis to rule out infectious process of the abdomen.  If elevated, will need to add on CT Angie of the chest. Patient's fever are not improving.  Now febrile to 102.3 despite the use of antipyretics.  Still tachypneic.  Will initiate sepsis protocol with broad-spectrum antibiotics.  Blood cultures and lactic obtained.  Preliminary malaria lab will be obtained. Care handed off to oncoming provider, Sherlie Ban, PA-C, who will dispo accordingly.  Portions of this note were generated with Scientist, clinical (histocompatibility and immunogenetics). Dictation errors may occur despite best attempts at proofreading.   Final Clinical Impressions(s) / ED Diagnoses   Final diagnoses:  None    ED Discharge Orders    None          Dietrich Pates, PA-C 05/01/18 0710    Ward, Layla Maw, DO 05/01/18 [phone]

## 2018-05-01 NOTE — ED Triage Notes (Signed)
Patient states that he awoke with chills that led to SOB and CP. Was in [place] 3 weeks ago, and when he came back had diarrhea - was placed on a Z-pack and symptoms have resolved.

## 2018-05-01 NOTE — Progress Notes (Signed)
Pharmacy Antibiotic Note  Louis Watson is a 62 y.o. male admitted on 05/01/2018 with sepsis.    Plan: Aztreonam 2 g x 1 then 1 g q8 Vanc 1 g q12h Flagyl 500 mg q8h Monitor renal fx cx vt prn  Height: 5\' 7"  (170.2 cm) Weight: 270 lb (122.5 kg) IBW/kg (Calculated) : 66.1  Temp (24hrs), Avg:99.8 F (37.7 C), Min:97.6 F (36.4 C), Max:102.3 F (39.1 C)  Recent Labs  Lab 05/01/18 0333 05/01/18 0731 05/01/18 0922  WBC 13.6*  --   --   CREATININE 1.15  --   --   LATICACIDVEN  --  2.34* 2.20*    Estimated Creatinine Clearance: 82.5 mL/min (by C-G formula based on SCr of 1.15 mg/dL).    Allergies  Allergen Reactions  . Penicillins Rash   Isaac Bliss, PharmD, BCPS, BCCCP Clinical Pharmacist [phone]  Please check AMION for all [facility] Pharm[facility] numbers  05/01/2018 1:32 PM

## 2018-05-01 NOTE — ED Provider Notes (Signed)
62 year old male presents with fever, chills, and non-specific symptoms. PMH of diverticulosis, HTN. He was in [place] for 3 weeks. He had diarrhea when he came back but this has resolved after taking Imodium. This morning woke up with intense chills and body aches. He is febrile to 102.3 and tacypneic. BP is soft. He had transient hypoxia in the ED. HR is normal. CBC is remarkable for mild leukocytosis. CMP and lipase are normal. UA is normal. CXR is clear. Lactic acid is mildly elevated to 2.34. Malaria screen was sent. He was given 4L fluid and broad spectrum antibiotics. D-dimer was elevated therefore CTA of the chest and a CT of chest/abdomen/pelvis was ordered by previous provider. Will follow results.  CT of the chest/abdomen/pelvis are normal. Discussed with critical care regarding the patient's hypotension. They recommend continuing fluids since he had a diarrheal illness and appears clinically well. Will switch to LR infusion and discuss with hospitalist.  [facility] to admit   Bethel Born, PA-C 05/01/18 1512    Long, Arlyss Repress, MD 05/01/18 [phone]

## 2018-05-01 NOTE — Progress Notes (Signed)
CRITICAL VALUE ALERT  Critical Value:  Lactic Acid 2.8  Date & Time Notied: 05/01/18 6:50pm  Provider Notified: Dr. Ophelia Charter  Orders Received/Actions taken:  500 LR Bolus ordered

## 2018-05-01 NOTE — Progress Notes (Signed)
Pharmacy Antibiotic Note  Louis Watson is a 62 y.o. male admitted on 05/01/2018 with sepsis.  Pharmacy has been consulted for Meropenem dosing.  Vancomycin, Aztreonam and Flagyl begun earlier today, all now discontinued.  Hx PCN allergy, rash.  Noted plan for ID consult.  Plan:  Meropenem 1 gm IV q8hrs.  Follow renal function, culture data, clinical progress, antibiotic tolerance.  Follow up ID input and antibiotic plans.  Height: 5\' 7"  (170.2 cm) Weight: 270 lb (122.5 kg) IBW/kg (Calculated) : 66.1  Temp (24hrs), Avg:99.5 F (37.5 C), Min:97.6 F (36.4 C), Max:102.3 F (39.1 C)  Recent Labs  Lab 05/01/18 0333 05/01/18 0731 05/01/18 0922  WBC 13.6*  --   --   CREATININE 1.15  --   --   LATICACIDVEN  --  2.34* 2.20*    Estimated Creatinine Clearance: 82.5 mL/min (by C-G formula based on SCr of 1.15 mg/dL).    Allergies  Allergen Reactions  . Penicillins Rash    Antimicrobials this admission: Vancomycin 9/9 x 1 dose Flagyl 9/9 x 1 dose Aztreonam 9/9 x 2 doses Meropenem 9/9>>  Dose adjustments this admission:  n/a  Microbiology results: 9/9 blood x 2 - 9/9 urine -  Thank you for allowing pharmacy to be a part of this patient's care.  Dennie Fetters, [place] Pager: [phone] or phone: [phone] 05/01/2018 5:24 PM

## 2018-05-01 NOTE — ED Notes (Addendum)
[phone] Louis Watson spouse

## 2018-05-02 ENCOUNTER — Inpatient Hospital Stay (HOSPITAL_COMMUNITY): Payer: BLUE CROSS/BLUE SHIELD

## 2018-05-02 DIAGNOSIS — R509 Fever, unspecified: Secondary | ICD-10-CM | POA: Diagnosis not present

## 2018-05-02 DIAGNOSIS — G4733 Obstructive sleep apnea (adult) (pediatric): Secondary | ICD-10-CM | POA: Diagnosis not present

## 2018-05-02 DIAGNOSIS — Z7984 Long term (current) use of oral hypoglycemic drugs: Secondary | ICD-10-CM | POA: Diagnosis not present

## 2018-05-02 DIAGNOSIS — Z7982 Long term (current) use of aspirin: Secondary | ICD-10-CM | POA: Diagnosis not present

## 2018-05-02 DIAGNOSIS — L03116 Cellulitis of left lower limb: Secondary | ICD-10-CM | POA: Diagnosis not present

## 2018-05-02 DIAGNOSIS — I10 Essential (primary) hypertension: Secondary | ICD-10-CM | POA: Diagnosis not present

## 2018-05-02 DIAGNOSIS — I959 Hypotension, unspecified: Secondary | ICD-10-CM

## 2018-05-02 DIAGNOSIS — R51 Headache: Secondary | ICD-10-CM | POA: Diagnosis not present

## 2018-05-02 DIAGNOSIS — L02612 Cutaneous abscess of left foot: Secondary | ICD-10-CM | POA: Diagnosis not present

## 2018-05-02 DIAGNOSIS — F17290 Nicotine dependence, other tobacco product, uncomplicated: Secondary | ICD-10-CM | POA: Diagnosis not present

## 2018-05-02 DIAGNOSIS — E119 Type 2 diabetes mellitus without complications: Secondary | ICD-10-CM | POA: Diagnosis not present

## 2018-05-02 DIAGNOSIS — Z880 Allergy status to penicillin: Secondary | ICD-10-CM

## 2018-05-02 DIAGNOSIS — K029 Dental caries, unspecified: Secondary | ICD-10-CM

## 2018-05-02 DIAGNOSIS — R42 Dizziness and giddiness: Secondary | ICD-10-CM | POA: Diagnosis not present

## 2018-05-02 DIAGNOSIS — Z6841 Body Mass Index (BMI) 40.0 and over, adult: Secondary | ICD-10-CM | POA: Diagnosis not present

## 2018-05-02 DIAGNOSIS — Z79899 Other long term (current) drug therapy: Secondary | ICD-10-CM | POA: Diagnosis not present

## 2018-05-02 DIAGNOSIS — A419 Sepsis, unspecified organism: Secondary | ICD-10-CM | POA: Diagnosis not present

## 2018-05-02 DIAGNOSIS — R112 Nausea with vomiting, unspecified: Secondary | ICD-10-CM

## 2018-05-02 LAB — PROCALCITONIN: Procalcitonin: 1.52 ng/mL

## 2018-05-02 LAB — GLUCOSE, CAPILLARY
GLUCOSE-CAPILLARY: 73 mg/dL (ref 70–99)
Glucose-Capillary: 95 mg/dL (ref 70–99)
Glucose-Capillary: 124 mg/dL — ABNORMAL HIGH (ref 70–99)

## 2018-05-02 MED ORDER — LACTATED RINGERS IV SOLN
INTRAVENOUS | Status: AC
  Administered 2018-05-02: 13:00:00 via INTRAVENOUS
  Administered 2018-05-02: 1 mL via INTRAVENOUS

## 2018-05-02 NOTE — Progress Notes (Signed)
[facility] PROGRESS NOTE  Louis Watson AOZ:[id number] DOB: 18-Jan-1956 DOA: 05/01/2018 PCP: Helane Rima, DO  Brief summary   62 y.o. male with medical history significant of HTN; DM; and OSA presenting with chills.  It started with chills about 2AM - it lasted for about an hour.  The got here about 3AM.  He spiked a fever to 103.  About 2 hours ago, the chills normalized - but he developed chills again recently.  No cough.  +SOB with chills.  He has been nauseated.  +headache.  No urinary symptoms.  He was in [place] about 3-4 weeks ago.  He had all his vaccines and took malaria and typhoid prophylaxis. He returned and 1 day later he developed diarrhea and abdominal pain without n/v.  Symptoms lasted about 4 days.  He called his NP at work - he was given a "disease pack and Lomotil" prior to going but did not take it.  She recommended that he take a Z-pack, which he finished.  His PCP then suggested stool samples and take Lomotil - but the Lomotil ended his diarrhea and he never had a stool sample.  He has not had any further diarrhea.  He thinks he was fine after that other than "odd aches and pains in my digestive system."     ED Course:  Fever, hypotension.  He was in [place] recently.  Returned with a diarrheal illness.  Saw PCP on 8/28 and given Imodium with improvement in diarrhea.  Last night with acute chills, fever 102.  No source.  +leukocytosis.  PCCM consulted but did not think patient needs ICU.  CT C/A/P negative.  Assessment/Plan:  Fever. Unclear etiology. possible gi source. CT chest? Abd: unremarkable. Recent GI symptoms, but only received z pack+lamotil Sepsis on admission with mild hypotension, mild lactic acidosis. Sepsis physiology resolved. lactate trended down to normal (2.3-->1.4). Patient is on broad spectrum iv antibiotics on admission, pend blood cultures.  Pend hepatitis profile, HIV. No further diarrhea.   DM. Monitor on ISS. ha1c-5.8  History of HTN. BP is  soft on admission. Holding meds. monitor   Code Status: full Family Communication: d/w patient, RN (indicate person spoken with, relationship, and if by phone, the number) Disposition Plan: home 24-48 hrs    Consultants:  none  Procedures:  none  Antibiotics: Anti-infectives (From admission, onward)   Start     Dose/Rate Route Frequency Ordered Stop   05/01/18 2000  vancomycin (VANCOCIN) IVPB 1000 mg/200 mL premix  Status:  Discontinued     1,000 mg 200 mL/hr over 60 Minutes Intravenous Every 12 hours 05/01/18 1331 05/01/18 1649   05/01/18 1800  meropenem (MERREM) 1 g in sodium chloride 0.9 % 100 mL IVPB     1 g 200 mL/hr over 30 Minutes Intravenous Every 8 hours 05/01/18 1722     05/01/18 1600  aztreonam (AZACTAM) 1 g in sodium chloride 0.9 % 100 mL IVPB  Status:  Discontinued     1 g 200 mL/hr over 30 Minutes Intravenous Every 8 hours 05/01/18 1331 05/01/18 1649   05/01/18 0715  aztreonam (AZACTAM) 2 g in sodium chloride 0.9 % 100 mL IVPB     2 g 200 mL/hr over 30 Minutes Intravenous  Once 05/01/18 0704 05/01/18 0821   05/01/18 0715  metroNIDAZOLE (FLAGYL) IVPB 500 mg  Status:  Discontinued     500 mg 100 mL/hr over 60 Minutes Intravenous Every 8 hours 05/01/18 0704 05/01/18 1649   05/01/18 0715  vancomycin (  VANCOCIN) IVPB 1000 mg/200 mL premix  Status:  Discontinued     1,000 mg 200 mL/hr over 60 Minutes Intravenous  Once 05/01/18 0704 05/01/18 0708   05/01/18 0715  vancomycin (VANCOCIN) 2,000 mg in sodium chloride 0.9 % 500 mL IVPB     2,000 mg 250 mL/hr over 120 Minutes Intravenous  Once 05/01/18 0708 05/01/18 1022        (indicate start date, and stop date if known)  HPI/Subjective: Reports feeling much better. No fevers since midnight. No vomiting, no acute respiratory symptoms.   Objective: Vitals:   05/01/18 2305 05/02/18 0500  BP:  (!) 108/56  Pulse: 79 63  Resp: 16 17  Temp:  98.1 F (36.7 C)  SpO2: 96% 96%    Intake/Output Summary (Last 24 hours)  at 05/02/2018 0939 Last data filed at 05/01/2018 2348 Gross per 24 hour  Intake 3503 ml  Output -  Net 3503 ml   Filed Weights   05/01/18 0328 05/01/18 1316 05/01/18 1640  Weight: 124.7 kg 122.5 kg 122.5 kg    Exam:   General:  No distress   Cardiovascular: s1,s2 rrr  Respiratory: CTA BL  Abdomen: soft, nt   Musculoskeletal: no leg edema    Data Reviewed: Basic Metabolic Panel: Recent Labs  Lab 05/01/18 0333  NA 141  K 3.8  CL 103  CO2 27  GLUCOSE 100*  BUN 17  CREATININE 1.15  CALCIUM 9.2   Liver Function Tests: Recent Labs  Lab 05/01/18 0333  AST 30  ALT 33  ALKPHOS 64  BILITOT 1.0  PROT 7.1  ALBUMIN 3.9   Recent Labs  Lab 05/01/18 0333  LIPASE 44   No results for input(s): AMMONIA in the last 168 hours. CBC: Recent Labs  Lab 05/01/18 0333  WBC 13.6*  HGB 16.2  HCT 49.0  MCV 98.6  PLT 230   Cardiac Enzymes: No results for input(s): CKTOTAL, CKMB, CKMBINDEX, TROPONINI in the last 168 hours. BNP (last 3 results) No results for input(s): BNP in the last 8760 hours.  ProBNP (last 3 results) No results for input(s): PROBNP in the last 8760 hours.  CBG: Recent Labs  Lab 05/01/18 1648 05/01/18 2115 05/02/18 0736  GLUCAP 75 92 73    Recent Results (from the past 240 hour(s))  Blood culture (routine x 2)     Status: None (Preliminary result)   Collection Time: 05/01/18  7:00 AM  Result Value Ref Range Status   Specimen Description BLOOD RIGHT HAND  Final   Special Requests   Final    BOTTLES DRAWN AEROBIC AND ANAEROBIC Blood Culture results may not be optimal due to an inadequate volume of blood received in culture bottles   Culture   Final    NO GROWTH < 24 HOURS Performed at [facility], [address], [place], [place] [address]    Report Status PENDING  Incomplete  Blood culture (routine x 2)     Status: None (Preliminary result)   Collection Time: 05/01/18  7:30 AM  Result Value Ref Range Status   Specimen Description  BLOOD LEFT ARM IV SITE  Final   Special Requests   Final    BOTTLES DRAWN AEROBIC AND ANAEROBIC Blood Culture results may not be optimal due to an inadequate volume of blood received in culture bottles   Culture   Final    NO GROWTH < 24 HOURS Performed at [facility], [address], [place], [place] [address]  Report Status PENDING  Incomplete     Studies: Dg Chest 2 View  Result Date: 05/01/2018 CLINICAL DATA:  Fever and chills. EXAM: CHEST - 2 VIEW COMPARISON:  None. FINDINGS: The cardiomediastinal contours are normal. The lungs are clear. Pulmonary vasculature is normal. No consolidation, pleural effusion, or pneumothorax. No acute osseous abnormalities are seen. IMPRESSION: Clear lungs. Electronically Signed   By: Narda Rutherford M.D.   On: 05/01/2018 04:39   Ct Angio Chest Pe W/cm &/or Wo Cm  Result Date: 05/01/2018 CLINICAL DATA:  Abdominal pain and diarrhea EXAM: CT ANGIOGRAPHY CHEST CT ABDOMEN AND PELVIS WITH CONTRAST TECHNIQUE: Multidetector CT imaging of the chest was performed using the standard protocol during bolus administration of intravenous contrast. Multiplanar CT image reconstructions and MIPs were obtained to evaluate the vascular anatomy. Multidetector CT imaging of the abdomen and pelvis was performed using the standard protocol during bolus administration of intravenous contrast. CONTRAST:  100 mL ISOVUE-370 IOPAMIDOL (ISOVUE-370) INJECTION 76% COMPARISON:  Chest x-ray from earlier in the same day. FINDINGS: CTA CHEST FINDINGS Cardiovascular: Mild atherosclerotic calcifications of the thoracic aorta are noted. No aneurysmal dilatation or dissection is seen. Mild coronary calcifications are noted. No significant cardiac enlargement is seen. The pulmonary artery is well visualized centrally although the timing of the contrast bolus is somewhat limited for peripheral embolus evaluation. No large central pulmonary embolus is seen. Mediastinum/Nodes: Thoracic inlet is  within normal limits. No sizable hilar or mediastinal adenopathy is noted. The esophagus is within normal limits. Lungs/Pleura: The lungs are well aerated bilaterally without focal infiltrate or sizable effusion. Musculoskeletal: Degenerative changes of the thoracic spine are noted. No acute bony abnormality is seen. Review of the MIP images confirms the above findings. CT ABDOMEN and PELVIS FINDINGS Hepatobiliary: No focal liver abnormality is seen. No gallstones, gallbladder wall thickening, or biliary dilatation. Pancreas: Unremarkable. No pancreatic ductal dilatation or surrounding inflammatory changes. Spleen: Normal in size without focal abnormality. Adrenals/Urinary Tract: Adrenal glands are within normal limits. Kidneys are well visualized bilaterally. Left renal cyst is noted in the lower pole measuring 3.4 cm. No renal calculi or obstructive changes are seen. The ureters are within normal limits. The bladder is partially distended. Stomach/Bowel: Scattered diverticular change of the colon is noted without evidence of diverticulitis. No obstructive or inflammatory changes are seen. The appendix is within normal limits. No specific small-bowel abnormality is noted. Vascular/Lymphatic: Aortic atherosclerosis. No enlarged abdominal or pelvic lymph nodes. Reproductive: Prostate is unremarkable. Other: No abdominal wall hernia or abnormality. No abdominopelvic ascites. Musculoskeletal: No acute or significant osseous findings. Chronic T11 compression deformity is noted. Review of the MIP images confirms the above findings. IMPRESSION: CT of the chest: No evidence of central pulmonary embolism. No acute abnormality in the lungs is noted. CT of the abdomen and pelvis: Diverticulosis without evidence of diverticulitis. Left renal cyst. Chronic changes as described above. Electronically Signed   By: Alcide Clever M.D.   On: 05/01/2018 11:51   Ct Abdomen Pelvis W Contrast  Result Date: 05/01/2018 CLINICAL DATA:   Abdominal pain and diarrhea EXAM: CT ANGIOGRAPHY CHEST CT ABDOMEN AND PELVIS WITH CONTRAST TECHNIQUE: Multidetector CT imaging of the chest was performed using the standard protocol during bolus administration of intravenous contrast. Multiplanar CT image reconstructions and MIPs were obtained to evaluate the vascular anatomy. Multidetector CT imaging of the abdomen and pelvis was performed using the standard protocol during bolus administration of intravenous contrast. CONTRAST:  100 mL ISOVUE-370 IOPAMIDOL (ISOVUE-370) INJECTION 76% COMPARISON:  Chest  x-ray from earlier in the same day. FINDINGS: CTA CHEST FINDINGS Cardiovascular: Mild atherosclerotic calcifications of the thoracic aorta are noted. No aneurysmal dilatation or dissection is seen. Mild coronary calcifications are noted. No significant cardiac enlargement is seen. The pulmonary artery is well visualized centrally although the timing of the contrast bolus is somewhat limited for peripheral embolus evaluation. No large central pulmonary embolus is seen. Mediastinum/Nodes: Thoracic inlet is within normal limits. No sizable hilar or mediastinal adenopathy is noted. The esophagus is within normal limits. Lungs/Pleura: The lungs are well aerated bilaterally without focal infiltrate or sizable effusion. Musculoskeletal: Degenerative changes of the thoracic spine are noted. No acute bony abnormality is seen. Review of the MIP images confirms the above findings. CT ABDOMEN and PELVIS FINDINGS Hepatobiliary: No focal liver abnormality is seen. No gallstones, gallbladder wall thickening, or biliary dilatation. Pancreas: Unremarkable. No pancreatic ductal dilatation or surrounding inflammatory changes. Spleen: Normal in size without focal abnormality. Adrenals/Urinary Tract: Adrenal glands are within normal limits. Kidneys are well visualized bilaterally. Left renal cyst is noted in the lower pole measuring 3.4 cm. No renal calculi or obstructive changes are  seen. The ureters are within normal limits. The bladder is partially distended. Stomach/Bowel: Scattered diverticular change of the colon is noted without evidence of diverticulitis. No obstructive or inflammatory changes are seen. The appendix is within normal limits. No specific small-bowel abnormality is noted. Vascular/Lymphatic: Aortic atherosclerosis. No enlarged abdominal or pelvic lymph nodes. Reproductive: Prostate is unremarkable. Other: No abdominal wall hernia or abnormality. No abdominopelvic ascites. Musculoskeletal: No acute or significant osseous findings. Chronic T11 compression deformity is noted. Review of the MIP images confirms the above findings. IMPRESSION: CT of the chest: No evidence of central pulmonary embolism. No acute abnormality in the lungs is noted. CT of the abdomen and pelvis: Diverticulosis without evidence of diverticulitis. Left renal cyst. Chronic changes as described above. Electronically Signed   By: Alcide Clever M.D.   On: 05/01/2018 11:51    Scheduled Meds: . aspirin EC  81 mg Oral Daily  . enoxaparin (LOVENOX) injection  40 mg Subcutaneous Q24H  . insulin aspart  0-15 Units Subcutaneous TID WC  . insulin aspart  0-5 Units Subcutaneous QHS  . sodium chloride flush  3 mL Intravenous Q12H   Continuous Infusions: . lactated ringers 150 mL/hr at 05/02/18 0400  . meropenem (MERREM) IV 1 g (05/02/18 0240)    Principal Problem:   Sepsis (HCC) Active Problems:   Hypertension   Sleep apnea    Time spent: >35 minutes     Esperanza Sheets  [facility] Pager [phone]. If 7PM-7AM, please contact night-coverage at [email or web address], password [facility] 05/02/2018, 9:39 AM  LOS: 1 day

## 2018-05-02 NOTE — Progress Notes (Signed)
[facility]  [facility] Infusion Coordinator will follow pt with ID team to support Home infusion pharmacy services at [place] for IV ABX if needed.  If patient discharges after hours, please call [phone].   Sedalia Muta 05/02/2018, 8:29 AM

## 2018-05-02 NOTE — Plan of Care (Signed)

## 2018-05-02 NOTE — Consult Note (Signed)
[facility]    Date of Admission:  05/01/2018   Total days of antibiotics 2 (vanc, aztreonam, metronidazole, all now stopped)        Day 2 meropenem               Reason for Consult: fever, concern for Salmonella    Referring Provider: York Spaniel Primary Care Provider: Helane Rima  Assessment: 62 yo male, healthy, with recent diarrhea after travel to [place], presented with fevers and hypotension concerning for sepsis. He is now clinically improved without fever and is hemodynamically stable. It is unclear what caused his sepsis presentation, and we are concerned for bloodstream infection nonetheless. It is unlikely to be from an thoracic or abdominal source since there was no evidence of such on CT C/A/P. He does have dental caries but orthopantogram did not show abscess. Disseminated salmonella is less likely because he has not had diarrhea for 2 weeks and received the typhoid vaccine. GI pathogen panel was not completed outpatient. His blood cultures are negative at 24 hours. It is unlikely to be malaria with a negative parasite smear and completed prophylaxis. We would like to continue meropenem and monitor him for another day. If he continues to do well, he may be safe for discharge tomorrow.  Plan: 1. Continue meropenem 2. Ordered orthopantogram 3. Follow up BCx  Principal Problem:   Sepsis (HCC) Active Problems:   Hypertension   Sleep apnea   Scheduled Meds: . aspirin EC  81 mg Oral Daily  . enoxaparin (LOVENOX) injection  40 mg Subcutaneous Q24H  . insulin aspart  0-15 Units Subcutaneous TID WC  . insulin aspart  0-5 Units Subcutaneous QHS  . sodium chloride flush  3 mL Intravenous Q12H   Continuous Infusions: . lactated ringers 150 mL/hr at 05/02/18 1322  . meropenem (MERREM) IV 1 g (05/02/18 0942)   PRN Meds:.acetaminophen, ondansetron **OR** ondansetron (ZOFRAN) IV, zolpidem  HPI: Louis Watson is a 62 y.o. male with no pertinent  history who presented with fatigue, fevers and chills.  He reports sudden onset of chills and generalized weakness in the very early morning of Monday 9/9. He recently diarrhea that started about 3 weeks and lasted about one week. He had traveled to [place] for a business trip from August 10th to the 16th. For this trip, he received HAV and HBV vaccines, typhoid fever vaccine, and took antimalarial pills. He primary ate food from the hotel, some local food, and drank bottled water. He did use running water from a faucet once to brush his teeth. He reports many mosquitos in [place] but doesn't recall getting any bites. One day after his arrival to the [place], he developed non-bloody watery diarrhea that lasted 1 week. He took a Z-pack for 3 days but is unsure if that helped him. He then took loperamide which did seem to help his diarrhea. Since, he has had regular well-formed stools and had been feeling well. He reports eating refried beans from a can on Sunday 9/8, but otherwise ate no unusual foods or did unusual activities. Three hours after falling asleep, he woke with severe chills, felt weak, and decided to come to the ED. In the ED, he reports having a fever and low blood pressures. He report having nausea and vomiting, and shortness of breath in the ED. This morning, he is feeling much better and feels like normal.  He reports having diarrhea years ago after travelling to  [place]. He travels often for this job and in April and May he was in [place] and [place]. He denies doing anything or going anywhere unusual during that trip.   Of note, He does have a history of 9 months of dizziness that he describes as "heavy-headed." He denies vertigo or syncope. He has been worked up for this by neurology but no clear diagnosis has been established.    Review of Systems: Review of Systems  Constitutional: Positive for chills, diaphoresis, fever and malaise/fatigue. Negative for weight loss.  HENT: Negative for  congestion, ear pain, sinus pain and sore throat.   Respiratory: Negative for cough, hemoptysis and shortness of breath.   Cardiovascular: Negative for chest pain, palpitations and leg swelling.  Gastrointestinal: Positive for abdominal pain, nausea and vomiting. Negative for blood in stool, diarrhea and melena.  Genitourinary: Negative for dysuria, frequency, hematuria and urgency.  Musculoskeletal: Negative for myalgias.  Skin: Negative for rash.  Neurological: Positive for dizziness and headaches. Negative for tingling, focal weakness and loss of consciousness.  Psychiatric/Behavioral: Negative for substance abuse.    Past Medical History:  Diagnosis Date  . Diverticulosis 01/17/2017  . Hx of lumbar discectomy 01/17/2017  . Hypertension   . OSA on CPAP    setting = 8  . Pancreatitis 2013  . Plantar fasciitis     Social History   Tobacco Use  . Smoking status: Current Every Day Smoker    Types: Cigars  . Smokeless tobacco: Never Used  Substance Use Topics  . Alcohol use: No  . Drug use: No    Family History  Problem Relation Age of Onset  . Congestive Heart Failure Mother    Allergies  Allergen Reactions  . Penicillins Rash    OBJECTIVE: Blood pressure (!) 108/56, pulse 63, temperature 98.1 F (36.7 C), temperature source Oral, resp. rate 17, height 5\' 7"  (1.702 m), weight 122.5 kg, SpO2 96 %.  Physical Exam  Constitutional: He is oriented to person, place, and time. He appears well-developed and well-nourished. No distress.  HENT:  Head: Normocephalic and atraumatic.  Mouth/Throat: No oropharyngeal exudate.   teeth caries  Eyes: Pupils are equal, round, and reactive to light. Conjunctivae and EOM are normal. No scleral icterus.  Neck: Normal range of motion. Neck supple.  Cardiovascular: Normal rate, regular rhythm and normal heart sounds.  No murmur heard. Pulmonary/Chest: Effort normal and breath sounds normal. He has no rales.  Abdominal: Soft. Bowel  sounds are normal. He exhibits no distension. There is no tenderness.  Musculoskeletal: He exhibits no edema.  Lymphadenopathy:    He has no cervical adenopathy.  Neurological: He is alert and oriented to person, place, and time. He exhibits normal muscle tone.  Skin: Skin is warm and dry.    Lab Results Lab Results  Component Value Date   WBC 13.6 (H) 05/01/2018   HGB 16.2 05/01/2018   HCT 49.0 05/01/2018   MCV 98.6 05/01/2018   PLT 230 05/01/2018    Lab Results  Component Value Date   CREATININE 1.15 05/01/2018   BUN 17 05/01/2018   NA 141 05/01/2018   K 3.8 05/01/2018   CL 103 05/01/2018   CO2 27 05/01/2018    Lab Results  Component Value Date   ALT 33 05/01/2018   AST 30 05/01/2018   ALKPHOS 64 05/01/2018   BILITOT 1.0 05/01/2018     Microbiology: Recent Results (from the past 240 hour(s))  Blood culture (routine x 2)  Status: None (Preliminary result)   Collection Time: 05/01/18  7:00 AM  Result Value Ref Range Status   Specimen Description BLOOD RIGHT HAND  Final   Special Requests   Final    BOTTLES DRAWN AEROBIC AND ANAEROBIC Blood Culture results may not be optimal due to an inadequate volume of blood received in culture bottles   Culture   Final    NO GROWTH 1 DAY Performed at [facility], [address], [place], [place] [address]    Report Status PENDING  Incomplete  Blood culture (routine x 2)     Status: None (Preliminary result)   Collection Time: 05/01/18  7:30 AM  Result Value Ref Range Status   Specimen Description BLOOD LEFT ARM IV SITE  Final   Special Requests   Final    BOTTLES DRAWN AEROBIC AND ANAEROBIC Blood Culture results may not be optimal due to an inadequate volume of blood received in culture bottles   Culture   Final    NO GROWTH 1 DAY Performed at [facility], [address] [place], [place] [address]    Report Status PENDING  Incomplete    Gweneth Fritter, [facility] [phone] pager   05/02/2018, 2:47 PM

## 2018-05-03 ENCOUNTER — Inpatient Hospital Stay (HOSPITAL_COMMUNITY): Payer: BLUE CROSS/BLUE SHIELD

## 2018-05-03 DIAGNOSIS — L03119 Cellulitis of unspecified part of limb: Secondary | ICD-10-CM

## 2018-05-03 DIAGNOSIS — L02619 Cutaneous abscess of unspecified foot: Secondary | ICD-10-CM

## 2018-05-03 DIAGNOSIS — R109 Unspecified abdominal pain: Secondary | ICD-10-CM | POA: Diagnosis not present

## 2018-05-03 DIAGNOSIS — R509 Fever, unspecified: Secondary | ICD-10-CM

## 2018-05-03 DIAGNOSIS — I340 Nonrheumatic mitral (valve) insufficiency: Secondary | ICD-10-CM

## 2018-05-03 DIAGNOSIS — R21 Rash and other nonspecific skin eruption: Secondary | ICD-10-CM

## 2018-05-03 DIAGNOSIS — I10 Essential (primary) hypertension: Secondary | ICD-10-CM | POA: Diagnosis not present

## 2018-05-03 DIAGNOSIS — G4730 Sleep apnea, unspecified: Secondary | ICD-10-CM | POA: Diagnosis not present

## 2018-05-03 DIAGNOSIS — R350 Frequency of micturition: Secondary | ICD-10-CM

## 2018-05-03 DIAGNOSIS — A419 Sepsis, unspecified organism: Secondary | ICD-10-CM | POA: Diagnosis not present

## 2018-05-03 DIAGNOSIS — R1110 Vomiting, unspecified: Secondary | ICD-10-CM | POA: Diagnosis not present

## 2018-05-03 LAB — CBC
HCT: 38 % — ABNORMAL LOW (ref 39.0–52.0)
Hemoglobin: 12.6 g/dL — ABNORMAL LOW (ref 13.0–17.0)
MCH: 32.5 pg (ref 26.0–34.0)
MCHC: 33.2 g/dL (ref 30.0–36.0)
MCV: 97.9 fL (ref 78.0–100.0)
Platelets: 154 10*3/uL (ref 150–400)
RBC: 3.88 MIL/uL — AB (ref 4.22–5.81)
RDW: 12 % (ref 11.5–15.5)
WBC: 7.7 10*3/uL (ref 4.0–10.5)

## 2018-05-03 LAB — BASIC METABOLIC PANEL
ANION GAP: 8 (ref 5–15)
BUN: 13 mg/dL (ref 8–23)
CALCIUM: 8 mg/dL — AB (ref 8.9–10.3)
CO2: 24 mmol/L (ref 22–32)
Chloride: 105 mmol/L (ref 98–111)
Creatinine, Ser: 0.99 mg/dL (ref 0.61–1.24)
GFR calc Af Amer: >60 mL/min (ref >60)
GFR calc non Af Amer: >60 mL/min (ref >60)
Glucose, Bld: 105 mg/dL — ABNORMAL HIGH (ref 70–99)
Potassium: 3.7 mmol/L (ref 3.5–5.1)
SODIUM: 137 mmol/L (ref 135–145)

## 2018-05-03 LAB — HEPATITIS PANEL, ACUTE
HEP A IGM: NEGATIVE
Hep B C IgM: NEGATIVE
Hepatitis B Surface Ag: NEGATIVE

## 2018-05-03 LAB — GLUCOSE, CAPILLARY
Glucose-Capillary: 97 mg/dL (ref 70–99)
Glucose-Capillary: 87 mg/dL (ref 70–99)
Glucose-Capillary: 101 mg/dL — ABNORMAL HIGH (ref 70–99)
Glucose-Capillary: 91 mg/dL (ref 70–99)

## 2018-05-03 LAB — PROCALCITONIN: Procalcitonin: 0.96 ng/mL

## 2018-05-03 LAB — ECHOCARDIOGRAM COMPLETE
HEIGHTINCHES: 67 in
Weight: 4321.02 oz

## 2018-05-03 LAB — CORTISOL-AM, BLOOD: Cortisol - AM: 8.2 ug/dL (ref 6.7–22.6)

## 2018-05-03 MED ORDER — LINEZOLID 600 MG PO TABS
600.0000 mg | ORAL_TABLET | Freq: Two times a day (BID) | ORAL | Status: DC
  Administered 2018-05-03 – 2018-05-04 (×3): 600 mg via ORAL
  Filled 2018-05-03 (×3): qty 1

## 2018-05-03 MED ORDER — LINEZOLID 600 MG PO TABS: 600.0000 mg | ORAL_TABLET | Freq: Two times a day (BID) | ORAL | 0 refills | Status: DC

## 2018-05-03 MED FILL — LINEZOLID 600 MG TABS: 600 | 12 days supply | Qty: 24 | Fill #0

## 2018-05-03 NOTE — Progress Notes (Signed)
[facility]  Date of Admission:  05/01/2018   Total days of antibiotics 3        Day 1 Linezolid         ASSESSMENT: 62 yo male with fever of unknown etiology, with a new left lower extremity erythema concerning for cellulitis/erysipelas. This developed yesterday afternoon, but was not present on our initial exam. It is possible that his fever was due to this as systemic symptoms such as his can precede onset of skin findings. There does not seem to be an abscess on exam, and there is no necrosis. Necrotizing fasciitis is on the differential but the erythema does not seem to be advancing quickly at this time. We are concerned that he may have bacteremia, so we have ordered a TTE. We also started him on po Linezolid which has some bloodstream coverage in addition to covering gram positives for his soft tissue infection. We would like to monitor him overnight and then he could go home if he is still stable.   PLAN: 1. Start Linezolid 2. TTE ordered 3. F/u BCx  Principal Problem:   Sepsis (HCC) Active Problems:   Hypertension   Sleep apnea   Febrile illness   FUO (fever of unknown origin)   Hypotension   Scheduled Meds: . aspirin EC  81 mg Oral Daily  . enoxaparin (LOVENOX) injection  40 mg Subcutaneous Q24H  . insulin aspart  0-15 Units Subcutaneous TID WC  . insulin aspart  0-5 Units Subcutaneous QHS  . linezolid  600 mg Oral Q12H  . sodium chloride flush  3 mL Intravenous Q12H   Continuous Infusions: PRN Meds:.ondansetron **OR** ondansetron (ZOFRAN) IV, zolpidem   SUBJECTIVE: [name] is feeling well. He did not sleep well however because he had to get up frequently at night to urinate. He also reports pain and redness on his left lower leg that he noticed about 4pm yesterday. It is very tender. It was marked and about 2 hours later, the redness expanded some, but not since. He area of pain has also expanded up to his mid-shin. He also  reports some abdominal discomfort and nausea, but denies vomiting or diarhea.   Review of Systems: Review of Systems  Constitutional: Negative for chills, diaphoresis and fever.  Respiratory: Negative.   Cardiovascular: Negative.   Gastrointestinal: Positive for abdominal pain and nausea. Negative for diarrhea and vomiting.  Genitourinary: Positive for frequency.  Skin: Positive for rash.  Neurological: Negative.     Allergies  Allergen Reactions  . Penicillins Rash    OBJECTIVE: Vitals:   05/02/18 0500 05/02/18 1832 05/03/18 0114 05/03/18 0611  BP: (!) 108/56 112/72 136/72 123/73  Pulse: 63 67 67 61  Resp: 17 16  16   Temp: 98.1 F (36.7 C) 99.2 F (37.3 C) 98.8 F (37.1 C) 98.5 F (36.9 C)  TempSrc: Oral Oral    SpO2: 96% 96% 97% 94%  Weight:      Height:       Body mass index is 42.3 kg/m.  Physical Exam  Constitutional: He is oriented to person, place, and time. He appears well-developed and well-nourished. No distress.  Eyes: Conjunctivae are normal.  Cardiovascular: Normal rate, regular rhythm and normal heart sounds.  No murmur heard. Pulmonary/Chest: Effort normal and breath sounds normal.  Abdominal: Soft. Bowel sounds are normal. He exhibits no distension. There is no tenderness.  Neurological: He is alert and oriented to person, place,  and time.  Skin: Rash noted. There is erythema.       Lab Results Lab Results  Component Value Date   WBC 7.7 05/03/2018   HGB 12.6 (L) 05/03/2018   HCT 38.0 (L) 05/03/2018   MCV 97.9 05/03/2018   PLT 154 05/03/2018    Lab Results  Component Value Date   CREATININE 0.99 05/03/2018   BUN 13 05/03/2018   NA 137 05/03/2018   K 3.7 05/03/2018   CL 105 05/03/2018   CO2 24 05/03/2018    Lab Results  Component Value Date   ALT 33 05/01/2018   AST 30 05/01/2018   ALKPHOS 64 05/01/2018   BILITOT 1.0 05/01/2018     Microbiology: Recent Results (from the past 240 hour(s))  Blood culture (routine x 2)      Status: None (Preliminary result)   Collection Time: 05/01/18  7:00 AM  Result Value Ref Range Status   Specimen Description BLOOD RIGHT HAND  Final   Special Requests   Final    BOTTLES DRAWN AEROBIC AND ANAEROBIC Blood Culture results may not be optimal due to an inadequate volume of blood received in culture bottles   Culture   Final    NO GROWTH 2 DAYS Performed at [facility], [address] [place], [place] [address]    Report Status PENDING  Incomplete  Blood culture (routine x 2)     Status: None (Preliminary result)   Collection Time: 05/01/18  7:30 AM  Result Value Ref Range Status   Specimen Description BLOOD LEFT ARM IV SITE  Final   Special Requests   Final    BOTTLES DRAWN AEROBIC AND ANAEROBIC Blood Culture results may not be optimal due to an inadequate volume of blood received in culture bottles   Culture   Final    NO GROWTH 2 DAYS Performed at [facility], [address] [place], [place] [address]    Report Status PENDING  Incomplete    Gweneth Fritter, [facility] [phone] pager    05/03/2018, 1:27 PM

## 2018-05-03 NOTE — Progress Notes (Signed)
Infectious Diseases Pharmacy Consult Note   [facility] Pharmacy was able to fill 12 days of linezolid for patient for $0.00 copay. This has been delivered to patient room.    Sharin Mons, PharmD, BCPS  Infectious Diseases Clinical Pharmacist Phone: [phone]

## 2018-05-03 NOTE — Progress Notes (Signed)
PROGRESS NOTE  [name] RDE:[id number] DOB: May 16, 1956 DOA: 05/01/2018 PCP: Briscoe Deutscher, DO   LOS: 2 days   Brief Narrative / Interim history: Louis Watson is a 62 y/o with medical history significant for diverticulosis, HTN, DM, and OSA who presented to the ED with fever, chills, and nausea. Pt returned from a 1 week trip to [place] 3 weeks ago and upon return developed diarrhea x4 days without N/V. Symptoms had Improved after PCP prescribed  imodium and azithromycin which he completed, and has had normal BMs since. States he had prophylactic vaccines and took malaria and typhoid rx before travel. Upon ED admission, SIRS criteria was met including leukocytosis, fever, and tachypnea with evidence of acute organ failure, elevated lactate, and borderline hypotension. Sepsis protocol was initiated, UA and blood cultures taken. Initially he was treated with IVF and vancomycin empirically. The patient was admitted to the hospital with suspected sepsis of uncertain source complicated by recent travel. Testing for HIV, malaria, parasite exam and typhoid initiated and ID was consulted.   Subjective: Patient states he did not sleep well last night due to abdominal discomfort/bloating and nocturia. He says after Tylenol and nausea meds he felt better. Had a solid BM last night. Noticed a rash developing yesterday afternoon in left inner shin area. Says it is painful to touch and when bearing weight, but does not itch. No drainage, scaling, crusting. Says he has eczema but this is different. Reports no injury. No fever, chills, vomiting, diarrhea.   Assessment & Plan: Principal Problem:   Sepsis ([place]) Active Problems:   Hypertension   Sleep apnea   Febrile illness   FUO (fever of unknown origin)   Hypotension  Fever - Patient initially admitted under sepsis protocol with unknown source complicated by recent travel  - Unclear etiology- labs have trended down WBC 7.7, lactic acid 1.4,  procalcitonin 0.96 - Blood cultures remain negative for growth - Hepatitis panel negative; parasite exam initially negative, awaiting final results; typhoid pending  - CXR negative; CT abdomen/pelvis negative; CT angio chest negative; Orthopantogram negative  - Discontinued meropenem; started on PO Zyvox x14days  - Spoke with ID, recommended patient remain in the hospital for further observation  - Echocardiogram completed today, awaiting results  - Unsure of etiology of rash- nonblanching presentation suggests vascular   HTN - Currently holding BP meds due to lower readings on admission - BP remains stable   DM - Continue sliding scale insulin  OSA - Continue CPAP   Scheduled Meds: . aspirin EC  81 mg Oral Daily  . enoxaparin (LOVENOX) injection  40 mg Subcutaneous Q24H  . insulin aspart  0-15 Units Subcutaneous TID WC  . insulin aspart  0-5 Units Subcutaneous QHS  . linezolid  600 mg Oral Q12H  . sodium chloride flush  3 mL Intravenous Q12H   Continuous Infusions: PRN Meds:.ondansetron **OR** ondansetron (ZOFRAN) IV, zolpidem  DVT prophylaxis: Lovenox Code Status: full code Family Communication: none present at bedside Disposition Plan: home pending clinical improvement  Consultants:   Infectious disease  Procedures:   none  Antimicrobials:  IV vancomycin- 9/9  Flagyl- 9/9  Aztreonem- 9/9  Merepenem- 9/9-9/11  PO Zyvox- 9/11   Objective: Vitals:   05/02/18 0500 05/02/18 1832 05/03/18 0114 05/03/18 0611  BP: (!) 108/56 112/72 136/72 123/73  Pulse: 63 67 67 61  Resp: '17 16  16  '$ Temp: 98.1 F (36.7 C) 99.2 F (37.3 C) 98.8 F (37.1 C) 98.5 F (36.9 C)  TempSrc: Oral Oral    SpO2: 96% 96% 97% 94%  Weight:      Height:        Intake/Output Summary (Last 24 hours) at 05/03/2018 1101 Last data filed at 05/02/2018 1848 Gross per 24 hour  Intake 1509.32 ml  Output 3 ml  Net 1506.32 ml   Filed Weights   05/01/18 0328 05/01/18 1316 05/01/18 1640    Weight: 124.7 kg 122.5 kg 122.5 kg    Examination:  Constitutional: Comfortable in bed, NAD Respiratory: Bilaterally clear to auscultation, normal respiratory effort  Cardiovascular: regular rate and rhythm, no murmurs. No LE edema  Abdomen: non tender to palpation Musculoskeletal: normal muscle tone Skin: 5x5 cm erythematous, nonblanching rash with ill-defined borders present on left medial LE/ankle. No drainage, blistering, weeping. Tender to palpation. Appears to be spreading outside circle drawn yesterday Neurologic: strength 5/5 in all 4  Psychiatric: alert and oriented x3. Normal mood and affect    Data Reviewed: I have independently reviewed following labs and imaging studies   CBC: Recent Labs  Lab 05/01/18 0333 05/03/18 0338  WBC 13.6* 7.7  HGB 16.2 12.6*  HCT 49.0 38.0*  MCV 98.6 97.9  PLT 230 419   Basic Metabolic Panel: Recent Labs  Lab 05/01/18 0333 05/03/18 0338  NA 141 137  K 3.8 3.7  CL 103 105  CO2 27 24  GLUCOSE 100* 105*  BUN 17 13  CREATININE 1.15 0.99  CALCIUM 9.2 8.0*   GFR: Estimated Creatinine Clearance: 95.8 mL/min (by C-G formula based on SCr of 0.99 mg/dL). Liver Function Tests: Recent Labs  Lab 05/01/18 0333  AST 30  ALT 33  ALKPHOS 64  BILITOT 1.0  PROT 7.1  ALBUMIN 3.9   Recent Labs  Lab 05/01/18 0333  LIPASE 44   No results for input(s): AMMONIA in the last 168 hours. Coagulation Profile: No results for input(s): INR, PROTIME in the last 168 hours. Cardiac Enzymes: No results for input(s): CKTOTAL, CKMB, CKMBINDEX, TROPONINI in the last 168 hours. BNP (last 3 results) No results for input(s): PROBNP in the last 8760 hours. HbA1C: Recent Labs    05/01/18 1637  HGBA1C 5.8*   CBG: Recent Labs  Lab 05/01/18 2115 05/02/18 0736 05/02/18 1221 05/02/18 1710 05/03/18 0944  GLUCAP 92 73 95 124* 97   Lipid Profile: No results for input(s): CHOL, HDL, LDLCALC, TRIG, CHOLHDL, LDLDIRECT in the last 72  hours. Thyroid Function Tests: No results for input(s): TSH, T4TOTAL, FREET4, T3FREE, THYROIDAB in the last 72 hours. Anemia Panel: No results for input(s): VITAMINB12, FOLATE, FERRITIN, TIBC, IRON, RETICCTPCT in the last 72 hours. Urine analysis:    Component Value Date/Time   COLORURINE YELLOW 05/01/2018 0330   APPEARANCEUR CLEAR 05/01/2018 0330   LABSPEC 1.015 05/01/2018 0330   PHURINE 5.0 05/01/2018 0330   GLUCOSEU NEGATIVE 05/01/2018 0330   HGBUR SMALL (A) 05/01/2018 0330   BILIRUBINUR NEGATIVE 05/01/2018 0330   KETONESUR NEGATIVE 05/01/2018 0330   PROTEINUR NEGATIVE 05/01/2018 0330   NITRITE NEGATIVE 05/01/2018 0330   LEUKOCYTESUR NEGATIVE 05/01/2018 0330   Sepsis Labs: Invalid input(s): PROCALCITONIN, LACTICIDVEN  Recent Results (from the past 240 hour(s))  Blood culture (routine x 2)     Status: None (Preliminary result)   Collection Time: 05/01/18  7:00 AM  Result Value Ref Range Status   Specimen Description BLOOD RIGHT HAND  Final   Special Requests   Final    BOTTLES DRAWN AEROBIC AND ANAEROBIC Blood Culture results may not be optimal  due to an inadequate volume of blood received in culture bottles   Culture   Final    NO GROWTH 2 DAYS Performed at [facility], [place] [address], [place], [place] [address]    Report Status PENDING  Incomplete  Blood culture (routine x 2)     Status: None (Preliminary result)   Collection Time: 05/01/18  7:30 AM  Result Value Ref Range Status   Specimen Description BLOOD LEFT ARM IV SITE  Final   Special Requests   Final    BOTTLES DRAWN AEROBIC AND ANAEROBIC Blood Culture results may not be optimal due to an inadequate volume of blood received in culture bottles   Culture   Final    NO GROWTH 2 DAYS Performed at [facility], [place] [address], [place], [place] [address]    Report Status PENDING  Incomplete      Radiology Studies: Dg Orthopantogram  Result Date: 05/02/2018 CLINICAL DATA:  Fever of unknown origin  for 2 days. EXAM: ORTHOPANTOGRAM/PANORAMIC COMPARISON:  None. FINDINGS: No evidence of periapical abscess is identified. Cavities in posterior upper molars are noted. IMPRESSION: Negative for evidence of periapical abscess. Cavities and upper molars should be amenable to direct visual inspection. Electronically Signed   By: Inge Rise M.D.   On: 05/02/2018 13:25   Ct Angio Chest Pe W/cm &/or Wo Cm  Result Date: 05/01/2018 CLINICAL DATA:  Abdominal pain and diarrhea EXAM: CT ANGIOGRAPHY CHEST CT ABDOMEN AND PELVIS WITH CONTRAST TECHNIQUE: Multidetector CT imaging of the chest was performed using the standard protocol during bolus administration of intravenous contrast. Multiplanar CT image reconstructions and MIPs were obtained to evaluate the vascular anatomy. Multidetector CT imaging of the abdomen and pelvis was performed using the standard protocol during bolus administration of intravenous contrast. CONTRAST:  100 mL ISOVUE-370 IOPAMIDOL (ISOVUE-370) INJECTION 76% COMPARISON:  Chest x-ray from earlier in the same day. FINDINGS: CTA CHEST FINDINGS Cardiovascular: Mild atherosclerotic calcifications of the thoracic aorta are noted. No aneurysmal dilatation or dissection is seen. Mild coronary calcifications are noted. No significant cardiac enlargement is seen. The pulmonary artery is well visualized centrally although the timing of the contrast bolus is somewhat limited for peripheral embolus evaluation. No large central pulmonary embolus is seen. Mediastinum/Nodes: Thoracic inlet is within normal limits. No sizable hilar or mediastinal adenopathy is noted. The esophagus is within normal limits. Lungs/Pleura: The lungs are well aerated bilaterally without focal infiltrate or sizable effusion. Musculoskeletal: Degenerative changes of the thoracic spine are noted. No acute bony abnormality is seen. Review of the MIP images confirms the above findings. CT ABDOMEN and PELVIS FINDINGS Hepatobiliary: No focal  liver abnormality is seen. No gallstones, gallbladder wall thickening, or biliary dilatation. Pancreas: Unremarkable. No pancreatic ductal dilatation or surrounding inflammatory changes. Spleen: Normal in size without focal abnormality. Adrenals/Urinary Tract: Adrenal glands are within normal limits. Kidneys are well visualized bilaterally. Left renal cyst is noted in the lower pole measuring 3.4 cm. No renal calculi or obstructive changes are seen. The ureters are within normal limits. The bladder is partially distended. Stomach/Bowel: Scattered diverticular change of the colon is noted without evidence of diverticulitis. No obstructive or inflammatory changes are seen. The appendix is within normal limits. No specific small-bowel abnormality is noted. Vascular/Lymphatic: Aortic atherosclerosis. No enlarged abdominal or pelvic lymph nodes. Reproductive: Prostate is unremarkable. Other: No abdominal wall hernia or abnormality. No abdominopelvic ascites. Musculoskeletal: No acute or significant osseous findings. Chronic T11 compression deformity is noted. Review of  the MIP images confirms the above findings. IMPRESSION: CT of the chest: No evidence of central pulmonary embolism. No acute abnormality in the lungs is noted. CT of the abdomen and pelvis: Diverticulosis without evidence of diverticulitis. Left renal cyst. Chronic changes as described above. Electronically Signed   By: Inez Catalina M.D.   On: 05/01/2018 11:51   Ct Abdomen Pelvis W Contrast  Result Date: 05/01/2018 CLINICAL DATA:  Abdominal pain and diarrhea EXAM: CT ANGIOGRAPHY CHEST CT ABDOMEN AND PELVIS WITH CONTRAST TECHNIQUE: Multidetector CT imaging of the chest was performed using the standard protocol during bolus administration of intravenous contrast. Multiplanar CT image reconstructions and MIPs were obtained to evaluate the vascular anatomy. Multidetector CT imaging of the abdomen and pelvis was performed using the standard protocol during  bolus administration of intravenous contrast. CONTRAST:  100 mL ISOVUE-370 IOPAMIDOL (ISOVUE-370) INJECTION 76% COMPARISON:  Chest x-ray from earlier in the same day. FINDINGS: CTA CHEST FINDINGS Cardiovascular: Mild atherosclerotic calcifications of the thoracic aorta are noted. No aneurysmal dilatation or dissection is seen. Mild coronary calcifications are noted. No significant cardiac enlargement is seen. The pulmonary artery is well visualized centrally although the timing of the contrast bolus is somewhat limited for peripheral embolus evaluation. No large central pulmonary embolus is seen. Mediastinum/Nodes: Thoracic inlet is within normal limits. No sizable hilar or mediastinal adenopathy is noted. The esophagus is within normal limits. Lungs/Pleura: The lungs are well aerated bilaterally without focal infiltrate or sizable effusion. Musculoskeletal: Degenerative changes of the thoracic spine are noted. No acute bony abnormality is seen. Review of the MIP images confirms the above findings. CT ABDOMEN and PELVIS FINDINGS Hepatobiliary: No focal liver abnormality is seen. No gallstones, gallbladder wall thickening, or biliary dilatation. Pancreas: Unremarkable. No pancreatic ductal dilatation or surrounding inflammatory changes. Spleen: Normal in size without focal abnormality. Adrenals/Urinary Tract: Adrenal glands are within normal limits. Kidneys are well visualized bilaterally. Left renal cyst is noted in the lower pole measuring 3.4 cm. No renal calculi or obstructive changes are seen. The ureters are within normal limits. The bladder is partially distended. Stomach/Bowel: Scattered diverticular change of the colon is noted without evidence of diverticulitis. No obstructive or inflammatory changes are seen. The appendix is within normal limits. No specific small-bowel abnormality is noted. Vascular/Lymphatic: Aortic atherosclerosis. No enlarged abdominal or pelvic lymph nodes. Reproductive: Prostate is  unremarkable. Other: No abdominal wall hernia or abnormality. No abdominopelvic ascites. Musculoskeletal: No acute or significant osseous findings. Chronic T11 compression deformity is noted. Review of the MIP images confirms the above findings. IMPRESSION: CT of the chest: No evidence of central pulmonary embolism. No acute abnormality in the lungs is noted. CT of the abdomen and pelvis: Diverticulosis without evidence of diverticulitis. Left renal cyst. Chronic changes as described above. Electronically Signed   By: Inez Catalina M.D.   On: 05/01/2018 11:51    Time spent: 25 min  [place] PA-S [facility]  If 7PM-7AM, please contact night-coverage [email or web address] Password [facility] 05/03/2018, 11:01 AM   Attending MD note: Patient was seen and examined-agree with the above assessment and plan.  62 year old with recent travel to [place]-approximately 4 weeks back-presenting with fever, he had a transient diarrheal illness a few days prior to this hospitalization that has since resolved.  Subjective: Has some mild erythema to his lower extremities right above his left ankle.  Objective: Chest: Bilaterally clear to auscultation CVS: S1-S2 regular Abdomen: Soft nontender nondistended Neurology: No edema  Labs: Blood cultures negative so far. Malaria smear  negative  Imaging: CT chest/abdomen negative for acute abnormalities  Assessment and plan Sepsis/febrile illness: Etiology unclear-no evidence of diarrhea-doubt typhoid-could be from developing soft tissue infection of his left extremity.  ID stopped meropenem and started on Zyvox.  ID recommending transthoracic echocardiogram-probably home if clinical improvement continues tomorrow morning provided blood cultures and TTE are negative.  DM-2: CBG stable on SSI.  Nena Alexander MD

## 2018-05-03 NOTE — Progress Notes (Signed)
  Echocardiogram 2D Echocardiogram has been performed.  Louis Watson Louis Watson 05/03/2018, 12:12 PM

## 2018-05-03 NOTE — Plan of Care (Signed)

## 2018-05-03 NOTE — Progress Notes (Signed)
Benefits check in process: Please check Zyvox 600mg  twice a day, copay and prior authorization X 12 days. Thanks NCM to f/u with results. Gae Gallop RN, BSN,CM

## 2018-05-04 ENCOUNTER — Telehealth: Payer: Self-pay | Admitting: Family Medicine

## 2018-05-04 ENCOUNTER — Telehealth: Payer: Self-pay | Admitting: *Deleted

## 2018-05-04 ENCOUNTER — Encounter: Payer: Self-pay | Admitting: *Deleted

## 2018-05-04 DIAGNOSIS — L03119 Cellulitis of unspecified part of limb: Secondary | ICD-10-CM | POA: Diagnosis not present

## 2018-05-04 DIAGNOSIS — I10 Essential (primary) hypertension: Secondary | ICD-10-CM

## 2018-05-04 DIAGNOSIS — L02619 Cutaneous abscess of unspecified foot: Secondary | ICD-10-CM

## 2018-05-04 DIAGNOSIS — G4730 Sleep apnea, unspecified: Secondary | ICD-10-CM | POA: Diagnosis not present

## 2018-05-04 DIAGNOSIS — A419 Sepsis, unspecified organism: Secondary | ICD-10-CM | POA: Diagnosis not present

## 2018-05-04 LAB — GLUCOSE, CAPILLARY: GLUCOSE-CAPILLARY: 91 mg/dL (ref 70–99)

## 2018-05-04 LAB — PARASITE EXAM, BLOOD

## 2018-05-04 MED ORDER — LINEZOLID 600 MG PO TABS: 600.0000 mg | ORAL_TABLET | Freq: Two times a day (BID) | ORAL | 0 refills | Status: AC

## 2018-05-04 MED ORDER — INSULIN ASPART 100 UNIT/ML ~~LOC~~ SOLN: 5.0000 [IU] | Freq: Once | SUBCUTANEOUS | Status: DC

## 2018-05-04 NOTE — Telephone Encounter (Signed)
See note

## 2018-05-04 NOTE — Progress Notes (Signed)
Discharge instructions given. Pt verbalized understanding and all questions were answered.  

## 2018-05-04 NOTE — Plan of Care (Signed)
POC reviewed with patient. Patient slept peacefully throughout the night, no acute changes. RN to report off to oncoming RN.

## 2018-05-04 NOTE — Discharge Summary (Signed)
Physician Discharge Summary  Louis Watson ZOX:[id number] DOB: [birth year] DOA: 05/01/2018  PCP: Helane Rima, DO  Admit date: 05/01/2018 Discharge date: 05/04/2018  Admitted From: home Disposition:  home  Recommendations for Outpatient Follow-up:  1. Follow up with PCP in 1-2 weeks  Home Health: none Equipment/Devices: none  Discharge Condition: stable CODE STATUS: full code Diet recommendation: heart healthy  Brief narrative: Louis Watson is a 62 y.o. male with medical history significant of HTN; DM;  OSA-with recent travel to [place] 3-4 weeks prior to this hospital stay-presented with fever and chills.  Patient did have self-limiting diarrheal illness a few days prior to this hospital stay as well for which he took Zithromax and Lomotil.  Patient underwent extensive evaluation including negative blood cultures, negative malaria smear and negative CT of the abdomen.  After his hospitalization, he developed a small erythematous area in the left lower extremity-right above his ankle that is consistent with cellulitis.  Infectious disease was consulted, patient was initially on meropenem-but has been switched to Zyvox.  He is no longer febrile, does not appear toxic-he is currently stable to be discharged.  See below for further details  Hospital Course: Sepsis: Presented with fever with sweats-recent travel history to [place] 3-4 weeks prior to this hospitalization.  Recent self-limiting diarrheal illness a few days prior to this hospitalization as well.  CT of the abdomen and chest was negative for any acute abnormalities/infective foci.  Malarial smear and blood cultures are negative as well.  Patient was seen by infectious disease-initially kept on empiric meropenem-but post hospitalization-patient developed a small erythematous and tender area in the left lower extremity-meropenem was stopped-patient was transitioned to Zyvox.  All cultures continue to to be negative, he continues to be  afebrile and nontoxic-appearing-2D echocardiogram did not show any obvious vegetations.  This MD spoke with infectious disease MD on call on the day of discharge-okay to discharge with Zyvox, they will arrange for patient to follow-up with Dr. Daiva Eves.  HTN- Initially patient's BP was low-normal on admission and home HTN meds were held. BP has been stable, resume home meds.    DM- Patient on sliding scale insulin during hospital stay and sugars remained stable. Hgb A1c 5.6. Discontinue insulin and resume Metformin.  OSA- No complications during hospital stay, ontinue CPAP.   Discharge Diagnoses:  Principal Problem:   Sepsis (HCC) Active Problems:   Hypertension   Sleep apnea   Febrile illness   FUO (fever of unknown origin)   Hypotension   Cellulitis and abscess of foot   Discharge Instructions  Discharge Instructions    Call MD for:  redness, tenderness, or signs of infection (pain, swelling, redness, odor or green/yellow discharge around incision site)   Complete by:  As directed    Diet - low sodium heart healthy   Complete by:  As directed    Discharge instructions   Complete by:  As directed    Follow with Primary MD  Helane Rima, DO in 1 week  Follow with Dr Daiva Eves (ID MD) office will call you with a appointment  Please get a complete blood count and chemistry panel checked by your Primary MD at your next visit, and again as instructed by your Primary MD.  Get Medicines reviewed and adjusted: Please take all your medications with you for your next visit with your Primary MD  Laboratory/radiological data: Please request your Primary MD to go over all hospital tests and procedure/radiological results at the follow up, please  ask your Primary MD to get all Hospital records sent to his/her office.  In some cases, they will be blood work, cultures and biopsy results pending at the time of your discharge. Please request that your primary care M.D. follows up on these  results.  Also Note the following: If you experience worsening of your admission symptoms, develop shortness of breath, life threatening emergency, suicidal or homicidal thoughts you must seek medical attention immediately by calling 911 or calling your MD immediately  if symptoms less severe.  You must read complete instructions/literature along with all the possible adverse reactions/side effects for all the Medicines you take and that have been prescribed to you. Take any new Medicines after you have completely understood and accpet all the possible adverse reactions/side effects.   Do not drive when taking Pain medications or sleeping medications (Benzodaizepines)  Do not take more than prescribed Pain, Sleep and Anxiety Medications. It is not advisable to combine anxiety,sleep and pain medications without talking with your primary care practitioner  Special Instructions: If you have smoked or chewed Tobacco  in the last 2 yrs please stop smoking, stop any regular Alcohol  and or any Recreational drug use.  Wear Seat belts while driving.  Please note: You were cared for by a hospitalist during your hospital stay. Once you are discharged, your primary care physician will handle any further medical issues. Please note that NO REFILLS for any discharge medications will be authorized once you are discharged, as it is imperative that you return to your primary care physician (or establish a relationship with a primary care physician if you do not have one) for your post hospital discharge needs so that they can reassess your need for medications and monitor your lab values.   Increase activity slowly   Complete by:  As directed      Allergies as of 05/04/2018      Reactions   Penicillins Rash      Medication List    TAKE these medications   amLODipine 5 MG tablet Commonly known as:  NORVASC Take 1 tablet (5 mg total) by mouth daily.   aspirin EC 81 MG tablet Take 81 mg by mouth  daily.   augmented betamethasone dipropionate 0.05 % ointment Commonly known as:  DIPROLENE-AF Apply 1 application topically daily as needed (rash).   eszopiclone 2 MG Tabs tablet Commonly known as:  LUNESTA TAKE 1 TABLET BY MOUTH AT BEDTIME AS NEEDED FOR SLEEP *TAKE IMMEDIATELY BEFORE BED** What changed:    how much to take  how to take this  when to take this  reasons to take this  additional instructions   linezolid 600 MG tablet Commonly known as:  ZYVOX Take 1 tablet (600 mg total) by mouth 2 (two) times daily for 12 days.   loperamide 2 MG capsule Commonly known as:  IMODIUM Take 2 mg by mouth 4 (four) times daily as needed for diarrhea or loose stools.   metFORMIN 500 MG 24 hr tablet Commonly known as:  GLUCOPHAGE-XR Take 2 tablets (1,000 mg total) by mouth daily with breakfast.   sildenafil 20 MG tablet Commonly known as:  REVATIO 5 po x 1, take 0.5 to 4 hours prior to sexual activity What changed:    how much to take  how to take this  when to take this  reasons to take this  additional instructions       Consultations:  Infectious disease  Procedures/Studies:  2D echo 9/11  Dg Orthopantogram  Result Date: 05/02/2018 CLINICAL DATA:  Fever of unknown origin for 2 days. EXAM: ORTHOPANTOGRAM/PANORAMIC COMPARISON:  None. FINDINGS: No evidence of periapical abscess is identified. Cavities in posterior upper molars are noted. IMPRESSION: Negative for evidence of periapical abscess. Cavities and upper molars should be amenable to direct visual inspection. Electronically Signed   By: Drusilla Kannerhomas  Dalessio M.D.   On: 05/02/2018 13:25   Dg Chest 2 View  Result Date: 05/01/2018 CLINICAL DATA:  Fever and chills. EXAM: CHEST - 2 VIEW COMPARISON:  None. FINDINGS: The cardiomediastinal contours are normal. The lungs are clear. Pulmonary vasculature is normal. No consolidation, pleural effusion, or pneumothorax. No acute osseous abnormalities are seen. IMPRESSION:  Clear lungs. Electronically Signed   By: Narda RutherfordMelanie  Sanford M.D.   On: 05/01/2018 04:39   Ct Angio Chest Pe W/cm &/or Wo Cm  Result Date: 05/01/2018 CLINICAL DATA:  Abdominal pain and diarrhea EXAM: CT ANGIOGRAPHY CHEST CT ABDOMEN AND PELVIS WITH CONTRAST TECHNIQUE: Multidetector CT imaging of the chest was performed using the standard protocol during bolus administration of intravenous contrast. Multiplanar CT image reconstructions and MIPs were obtained to evaluate the vascular anatomy. Multidetector CT imaging of the abdomen and pelvis was performed using the standard protocol during bolus administration of intravenous contrast. CONTRAST:  100 mL ISOVUE-370 IOPAMIDOL (ISOVUE-370) INJECTION 76% COMPARISON:  Chest x-ray from earlier in the same day. FINDINGS: CTA CHEST FINDINGS Cardiovascular: Mild atherosclerotic calcifications of the thoracic aorta are noted. No aneurysmal dilatation or dissection is seen. Mild coronary calcifications are noted. No significant cardiac enlargement is seen. The pulmonary artery is well visualized centrally although the timing of the contrast bolus is somewhat limited for peripheral embolus evaluation. No large central pulmonary embolus is seen. Mediastinum/Nodes: Thoracic inlet is within normal limits. No sizable hilar or mediastinal adenopathy is noted. The esophagus is within normal limits. Lungs/Pleura: The lungs are well aerated bilaterally without focal infiltrate or sizable effusion. Musculoskeletal: Degenerative changes of the thoracic spine are noted. No acute bony abnormality is seen. Review of the MIP images confirms the above findings. CT ABDOMEN and PELVIS FINDINGS Hepatobiliary: No focal liver abnormality is seen. No gallstones, gallbladder wall thickening, or biliary dilatation. Pancreas: Unremarkable. No pancreatic ductal dilatation or surrounding inflammatory changes. Spleen: Normal in size without focal abnormality. Adrenals/Urinary Tract: Adrenal glands are  within normal limits. Kidneys are well visualized bilaterally. Left renal cyst is noted in the lower pole measuring 3.4 cm. No renal calculi or obstructive changes are seen. The ureters are within normal limits. The bladder is partially distended. Stomach/Bowel: Scattered diverticular change of the colon is noted without evidence of diverticulitis. No obstructive or inflammatory changes are seen. The appendix is within normal limits. No specific small-bowel abnormality is noted. Vascular/Lymphatic: Aortic atherosclerosis. No enlarged abdominal or pelvic lymph nodes. Reproductive: Prostate is unremarkable. Other: No abdominal wall hernia or abnormality. No abdominopelvic ascites. Musculoskeletal: No acute or significant osseous findings. Chronic T11 compression deformity is noted. Review of the MIP images confirms the above findings. IMPRESSION: CT of the chest: No evidence of central pulmonary embolism. No acute abnormality in the lungs is noted. CT of the abdomen and pelvis: Diverticulosis without evidence of diverticulitis. Left renal cyst. Chronic changes as described above. Electronically Signed   By: Alcide CleverMark  Lukens M.D.   On: 05/01/2018 11:51   Ct Abdomen Pelvis W Contrast  Result Date: 05/01/2018 CLINICAL DATA:  Abdominal pain and diarrhea EXAM: CT ANGIOGRAPHY CHEST CT ABDOMEN AND PELVIS WITH CONTRAST TECHNIQUE: Multidetector CT imaging  of the chest was performed using the standard protocol during bolus administration of intravenous contrast. Multiplanar CT image reconstructions and MIPs were obtained to evaluate the vascular anatomy. Multidetector CT imaging of the abdomen and pelvis was performed using the standard protocol during bolus administration of intravenous contrast. CONTRAST:  100 mL ISOVUE-370 IOPAMIDOL (ISOVUE-370) INJECTION 76% COMPARISON:  Chest x-ray from earlier in the same day. FINDINGS: CTA CHEST FINDINGS Cardiovascular: Mild atherosclerotic calcifications of the thoracic aorta are noted.  No aneurysmal dilatation or dissection is seen. Mild coronary calcifications are noted. No significant cardiac enlargement is seen. The pulmonary artery is well visualized centrally although the timing of the contrast bolus is somewhat limited for peripheral embolus evaluation. No large central pulmonary embolus is seen. Mediastinum/Nodes: Thoracic inlet is within normal limits. No sizable hilar or mediastinal adenopathy is noted. The esophagus is within normal limits. Lungs/Pleura: The lungs are well aerated bilaterally without focal infiltrate or sizable effusion. Musculoskeletal: Degenerative changes of the thoracic spine are noted. No acute bony abnormality is seen. Review of the MIP images confirms the above findings. CT ABDOMEN and PELVIS FINDINGS Hepatobiliary: No focal liver abnormality is seen. No gallstones, gallbladder wall thickening, or biliary dilatation. Pancreas: Unremarkable. No pancreatic ductal dilatation or surrounding inflammatory changes. Spleen: Normal in size without focal abnormality. Adrenals/Urinary Tract: Adrenal glands are within normal limits. Kidneys are well visualized bilaterally. Left renal cyst is noted in the lower pole measuring 3.4 cm. No renal calculi or obstructive changes are seen. The ureters are within normal limits. The bladder is partially distended. Stomach/Bowel: Scattered diverticular change of the colon is noted without evidence of diverticulitis. No obstructive or inflammatory changes are seen. The appendix is within normal limits. No specific small-bowel abnormality is noted. Vascular/Lymphatic: Aortic atherosclerosis. No enlarged abdominal or pelvic lymph nodes. Reproductive: Prostate is unremarkable. Other: No abdominal wall hernia or abnormality. No abdominopelvic ascites. Musculoskeletal: No acute or significant osseous findings. Chronic T11 compression deformity is noted. Review of the MIP images confirms the above findings. IMPRESSION: CT of the chest: No  evidence of central pulmonary embolism. No acute abnormality in the lungs is noted. CT of the abdomen and pelvis: Diverticulosis without evidence of diverticulitis. Left renal cyst. Chronic changes as described above. Electronically Signed   By: Alcide Clever M.D.   On: 05/01/2018 11:51     Subjective: Patient is feeling healthy. No chest pain, abdominal pain, SOB, loose stools, fever, or chills. Says the rash on his leg is still painful but has not gotten worse or gotten larger.   Discharge Exam: Vitals:   05/03/18 2254 05/04/18 0621  BP:  108/64  Pulse: (!) 58 (!) 58  Resp: 18 18  Temp:  98.2 F (36.8 C)  SpO2: 96% 97%    General: Laying comfortably in bed, NAD Cardiovascular: regular rate and rhythm, no murmurs. No LE edema Respiratory: bilaterally clear to auscultation, normal respiratory effort Abdominal: nontender to palpation, soft, BS normoactive Extremities: erythematous, nonblanching rash with ill-defined borders present above left ankle. No scaling, drainage, blistering, weeping. Tender to palpation. Does not appear to have changed in size since yesterday.  The results of significant diagnostics from this hospitalization (including imaging, microbiology, ancillary and laboratory) are listed below for reference.     Microbiology: Recent Results (from the past 240 hour(s))  Blood culture (routine x 2)     Status: None (Preliminary result)   Collection Time: 05/01/18  7:00 AM  Result Value Ref Range Status   Specimen Description BLOOD RIGHT  HAND  Final   Special Requests   Final    BOTTLES DRAWN AEROBIC AND ANAEROBIC Blood Culture results may not be optimal due to an inadequate volume of blood received in culture bottles   Culture   Final    NO GROWTH 3 DAYS Performed at [facility], [address], [place], [place] [address]    Report Status PENDING  Incomplete  Blood culture (routine x 2)     Status: None (Preliminary result)   Collection Time: 05/01/18  7:30  AM  Result Value Ref Range Status   Specimen Description BLOOD LEFT ARM IV SITE  Final   Special Requests   Final    BOTTLES DRAWN AEROBIC AND ANAEROBIC Blood Culture results may not be optimal due to an inadequate volume of blood received in culture bottles   Culture   Final    NO GROWTH 3 DAYS Performed at [facility], [address] [place], [place] [address]    Report Status PENDING  Incomplete     Labs: BNP (last 3 results) No results for input(s): BNP in the last 8760 hours. Basic Metabolic Panel: Recent Labs  Lab 05/01/18 0333 05/03/18 0338  NA 141 137  K 3.8 3.7  CL 103 105  CO2 27 24  GLUCOSE 100* 105*  BUN 17 13  CREATININE 1.15 0.99  CALCIUM 9.2 8.0*   Liver Function Tests: Recent Labs  Lab 05/01/18 0333  AST 30  ALT 33  ALKPHOS 64  BILITOT 1.0  PROT 7.1  ALBUMIN 3.9   Recent Labs  Lab 05/01/18 0333  LIPASE 44   No results for input(s): AMMONIA in the last 168 hours. CBC: Recent Labs  Lab 05/01/18 0333 05/03/18 0338  WBC 13.6* 7.7  HGB 16.2 12.6*  HCT 49.0 38.0*  MCV 98.6 97.9  PLT 230 154   Cardiac Enzymes: No results for input(s): CKTOTAL, CKMB, CKMBINDEX, TROPONINI in the last 168 hours. BNP: Invalid input(s): POCBNP CBG: Recent Labs  Lab 05/03/18 0944 05/03/18 1339 05/03/18 1708 05/03/18 2238 05/04/18 0806  GLUCAP 97 87 101* 91 91   D-Dimer No results for input(s): DDIMER in the last 72 hours. Hgb A1c Recent Labs    05/01/18 1637  HGBA1C 5.8*   Lipid Profile No results for input(s): CHOL, HDL, LDLCALC, TRIG, CHOLHDL, LDLDIRECT in the last 72 hours. Thyroid function studies No results for input(s): TSH, T4TOTAL, T3FREE, THYROIDAB in the last 72 hours.  Invalid input(s): FREET3 Anemia work up No results for input(s): VITAMINB12, FOLATE, FERRITIN, TIBC, IRON, RETICCTPCT in the last 72 hours. Urinalysis    Component Value Date/Time   COLORURINE YELLOW 05/01/2018 0330   APPEARANCEUR CLEAR 05/01/2018 0330    LABSPEC 1.015 05/01/2018 0330   PHURINE 5.0 05/01/2018 0330   GLUCOSEU NEGATIVE 05/01/2018 0330   HGBUR SMALL (A) 05/01/2018 0330   BILIRUBINUR NEGATIVE 05/01/2018 0330   KETONESUR NEGATIVE 05/01/2018 0330   PROTEINUR NEGATIVE 05/01/2018 0330   NITRITE NEGATIVE 05/01/2018 0330   LEUKOCYTESUR NEGATIVE 05/01/2018 0330   Sepsis Labs Invalid input(s): PROCALCITONIN,  WBC,  LACTICIDVEN   Time coordinating discharge: 35 minutes  SIGNED:  Jeoffrey Massed, MD  [facility] 05/04/2018, 10:44 AM If 7PM-7AM, please contact night-coverage [email or web address] Password [email or web address]

## 2018-05-04 NOTE — Telephone Encounter (Signed)
Per chart review: Admit date: 05/01/2018 Discharge date: 05/04/2018  Admitted From: home Disposition:  home  Recommendations for Outpatient Follow-up:  1. Follow up with PCP in 1-2 weeks  Home Health: none Equipment/Devices: none  Discharge Condition: stable CODE STATUS: full code Diet recommendation: heart healthy _________________________________________________________________________ Per telephone call: Transition Care Management Follow-up Telephone Call   Date discharged? 05/04/18   How have you been since you were released from the hospital? "ok"   Do you understand why you were in the hospital? yes   Do you understand the discharge instructions? yes   Where were you discharged to? home   Items Reviewed:  Medications reviewed: yes  Allergies reviewed: yes  Dietary changes reviewed: yes  Referrals reviewed: yes   Functional Questionnaire:   Activities of Daily Living (ADLs):   He states they are independent in the following: ambulation, bathing and hygiene, feeding, continence, grooming, toileting and dressing States they require assistance with the following: none   Any transportation issues/concerns?: no   Any patient concerns? no   Confirmed importance and date/time of follow-up visits scheduled yes  Provider Appointment booked with Dr Earlene PlaterWallace 05/10/18 3:00  Confirmed with patient if condition begins to worsen call PCP or go to the ER.  Patient was given the office number and encouraged to call back with question or concerns.  : yes

## 2018-05-04 NOTE — Telephone Encounter (Signed)
Copied from CRM [id number]. Topic: Quick Communication - See Telephone Encounter >> May 04, 2018  2:13 PM Jens SomMedley, Jennifer A wrote: CRM for notification. See Telephone encounter for: 05/04/18.  Patient called to request a hospital follow up for Sepsis.  According to Dr. Earlene PlaterWallace schedule there are no appt for hosptial visits.

## 2018-05-05 LAB — BLOOD CULTURE ID PANEL (REFLEXED)
ACINETOBACTER BAUMANNII: NOT DETECTED
CANDIDA ALBICANS: NOT DETECTED
CANDIDA PARAPSILOSIS: NOT DETECTED
CANDIDA TROPICALIS: NOT DETECTED
Candida glabrata: NOT DETECTED
Candida krusei: NOT DETECTED
ENTEROBACTERIACEAE SPECIES: NOT DETECTED
ENTEROCOCCUS SPECIES: NOT DETECTED
Enterobacter cloacae complex: NOT DETECTED
Escherichia coli: NOT DETECTED
HAEMOPHILUS INFLUENZAE: NOT DETECTED
KLEBSIELLA OXYTOCA: NOT DETECTED
Klebsiella pneumoniae: NOT DETECTED
LISTERIA MONOCYTOGENES: NOT DETECTED
NEISSERIA MENINGITIDIS: NOT DETECTED
Proteus species: NOT DETECTED
Pseudomonas aeruginosa: NOT DETECTED
SERRATIA MARCESCENS: NOT DETECTED
STAPHYLOCOCCUS AUREUS BCID: NOT DETECTED
STREPTOCOCCUS AGALACTIAE: NOT DETECTED
STREPTOCOCCUS PYOGENES: NOT DETECTED
STREPTOCOCCUS SPECIES: NOT DETECTED
Staphylococcus species: NOT DETECTED
Streptococcus pneumoniae: NOT DETECTED

## 2018-05-06 LAB — CULTURE, BLOOD (ROUTINE X 2): CULTURE: NO GROWTH

## 2018-05-07 LAB — CULTURE, BLOOD (ROUTINE X 2)

## 2018-05-09 NOTE — Progress Notes (Signed)
Late Note: Notified by lab about 1/2 + blood cultures-had spoken with Dr Deatra InaSnider-who thought most likely this was a contaminant-and that she will keep a eye on the final results

## 2018-05-10 ENCOUNTER — Ambulatory Visit (INDEPENDENT_AMBULATORY_CARE_PROVIDER_SITE_OTHER): Payer: BLUE CROSS/BLUE SHIELD | Admitting: Family Medicine

## 2018-05-10 ENCOUNTER — Encounter: Payer: Self-pay | Admitting: Family Medicine

## 2018-05-10 DIAGNOSIS — I10 Essential (primary) hypertension: Secondary | ICD-10-CM | POA: Diagnosis not present

## 2018-05-10 DIAGNOSIS — Z8619 Personal history of other infectious and parasitic diseases: Secondary | ICD-10-CM

## 2018-05-10 DIAGNOSIS — R197 Diarrhea, unspecified: Secondary | ICD-10-CM

## 2018-05-10 DIAGNOSIS — Z09 Encounter for follow-up examination after completed treatment for conditions other than malignant neoplasm: Secondary | ICD-10-CM

## 2018-05-10 NOTE — Patient Instructions (Addendum)
Hold Metformin until GI issues resolve. You can stop this anytime you have GI issues.    Hold amlodipine until your blood pressures 140 systolic you may start back or if you have headaches.   Drink LOTS of Water. Must get at least 60-80 oz  ID will call you next week to make follow up on your listed Cell.

## 2018-05-10 NOTE — Progress Notes (Addendum)
Louis Watson is a 62 y.o. male is here for follow up.  History of Present Illness:   Louis Watson, CMA acting as scribe for Dr. Helane RimaErica Lowella Watson.   HPI: Patient was seen and admitted to hospital for 4 days. Patient was diagnosed with sepsis. He was discharged on 9/12. He is still having issues with fatigue, light headed and nausea. He has not had any loose stools or vomiting. He is getting around 40 oz of water today. Stressed the importance of proper hydration. He is still on antibiotic. He is not having abdominal pain at this time. But when he does it is in left side around to back.   He did have cellulitis on inside of left ankle that started when he was in the hospital. Treatment has cleared up rash. He sill has sensitivity to touch in the area but it is much better.   Blood pressure.He was told to hold amlodipine while in hospital and to hold until seen at PCP office.  He checked his blood pressure when last night while he was having symptoms and it was 103/78. His abdominal symptoms have not been that bad today nor has he had any light headedness.   He is concerned that it may have started from algae that may have been [place] few days before he left to go to [place].   There are no preventive care reminders to display for this patient.   [facility] 02/27/2018 01/11/2017  Decreased Interest 0 0  Down, Depressed, Hopeless 0 0  PHQ - 2 Score 0 0  Altered sleeping 0 -  Tired, decreased energy 0 -  Change in appetite 1 -  Feeling bad or failure about yourself  0 -  Trouble concentrating 0 -  Moving slowly or fidgety/restless 0 -  Suicidal thoughts 0 -  PHQ-9 Score 1 -  Difficult doing work/chores Not difficult at all -   PMHx, SurgHx, SocialHx, FamHx, Medications, and Allergies were reviewed in the Visit Navigator and updated as appropriate.   Patient Active Problem List   Diagnosis Date Noted  . Cellulitis and abscess of foot   . Febrile  illness   . FUO (fever of unknown origin)   . Hypotension   . Sepsis (HCC) 05/01/2018  . Dizziness 10/25/2017  . Eczema 06/17/2017  . Partial tear of right subscapularis tendon 01/25/2017  . Hx of pancreatitis 01/17/2017  . Hx of lumbar discectomy 01/17/2017  . Diverticulosis 01/17/2017  . Erectile dysfunction 01/15/2017  . Chronic right shoulder pain 01/15/2017  . Bursitis of shoulder 09/10/2015  . Hypertension 10/29/2014  . Insomnia 10/29/2014  . Sleep apnea 10/29/2014   Social History   Tobacco Use  . Smoking status: Current Every Day Smoker    Types: Cigars  . Smokeless tobacco: Never Used  Substance Use Topics  . Alcohol use: No  . Drug use: No   Current Medications and Allergies:   .  amLODipine (NORVASC) 5 MG tablet, Take 1 tablet (5 mg total) by mouth daily., Disp: 90 tablet, Rfl: 3 .  aspirin EC 81 MG tablet, Take 81 mg by mouth daily., Disp: , Rfl:  .  augmented betamethasone dipropionate (DIPROLENE-AF) 0.05 % ointment, Apply 1 application topically daily as needed (rash). , Disp: , Rfl: 1 .  eszopiclone (LUNESTA) 2 MG TABS tablet, TAKE 1 TABLET BY MOUTH AT BEDTIME AS NEEDED FOR SLEEP *TAKE IMMEDIATELY BEFORE BED** (Patient taking differently: Take 2 mg by  mouth at bedtime as needed for sleep. ), Disp: 30 tablet, Rfl: 5 .  linezolid (ZYVOX) 600 MG tablet, Take 1 tablet (600 mg total) by mouth 2 (two) times daily for 12 days., Disp: 24 tablet, Rfl: 0 .  loperamide (IMODIUM) 2 MG capsule, Take 2 mg by mouth 4 (four) times daily as needed for diarrhea or loose stools. , Disp: , Rfl: 0 .  metFORMIN (GLUCOPHAGE XR) 500 MG 24 hr tablet, Take 2 tablets (1,000 mg total) by mouth daily with breakfast., Disp: 180 tablet, Rfl: 3 .  sildenafil (REVATIO) 20 MG tablet, 5 po x 1, take 0.5 to 4 hours prior to sexual activity (Patient taking differently: Take 20 mg by mouth as needed. 4 hours prior to sexual activity), Disp: 30 tablet, Rfl: 5   Allergies  Allergen Reactions  .  Penicillins Rash   Review of Systems   Pertinent items are noted in the HPI. Otherwise, ROS is negative.  Physical Exam:   Physical Exam  Constitutional: He is oriented to person, place, and time. He appears well-developed and well-nourished. No distress.  HENT:  Head: Normocephalic and atraumatic.  Right Ear: External ear normal.  Left Ear: External ear normal.  Nose: Nose normal.  Mouth/Throat: Oropharynx is clear and moist.  Eyes: Pupils are equal, round, and reactive to light. Conjunctivae and EOM are normal.  Neck: Normal range of motion. Neck supple.  Cardiovascular: Normal rate, regular rhythm, normal heart sounds and intact distal pulses.  Pulmonary/Chest: Effort normal and breath sounds normal.  Abdominal: Soft. Bowel sounds are normal.  Musculoskeletal: Normal range of motion.  Neurological: He is alert and oriented to person, place, and time.  Skin: Skin is warm and dry.  Psychiatric: He has a normal mood and affect. His behavior is normal. Judgment and thought content normal.  Nursing note and vitals reviewed.  Results for orders placed or performed in visit on 05/10/18  CBC with Differential/Platelet  Result Value Ref Range   WBC 6.3 4.0 - 10.5 K/uL   RBC 4.60 4.22 - 5.81 Mil/uL   Hemoglobin 15.3 13.0 - 17.0 g/dL   HCT 65.7 84.6 - 96.2 %   MCV 95.6 78.0 - 100.0 fl   MCHC 34.8 30.0 - 36.0 g/dL   RDW 95.2 84.1 - 32.4 %   Platelets 294.0 150.0 - 400.0 K/uL   Neutrophils Relative % 54.7 43.0 - 77.0 %   Lymphocytes Relative 31.4 12.0 - 46.0 %   Monocytes Relative 11.2 3.0 - 12.0 %   Eosinophils Relative 1.4 0.0 - 5.0 %   Basophils Relative 1.3 0.0 - 3.0 %   Neutro Abs 3.5 1.4 - 7.7 K/uL   Lymphs Abs 2.0 0.7 - 4.0 K/uL   Monocytes Absolute 0.7 0.1 - 1.0 K/uL   Eosinophils Absolute 0.1 0.0 - 0.7 K/uL   Basophils Absolute 0.1 0.0 - 0.1 K/uL  Comprehensive metabolic panel  Result Value Ref Range   Sodium 135 135 - 145 mEq/L   Potassium 4.1 3.5 - 5.1 mEq/L    Chloride 103 96 - 112 mEq/L   CO2 23 19 - 32 mEq/L   Glucose, Bld 106 (H) 70 - 99 mg/dL   BUN 18 6 - 23 mg/dL   Creatinine, Ser 4.01 0.40 - 1.50 mg/dL   Total Bilirubin 0.8 0.2 - 1.2 mg/dL   Alkaline Phosphatase 55 39 - 117 U/L   AST 37 0 - 37 U/L   ALT 81 (H) 0 - 53 U/L   Total  Protein 7.3 6.0 - 8.3 g/dL   Albumin 4.0 3.5 - 5.2 g/dL   Calcium 9.2 8.4 - 81.1 mg/dL   GFR 91.47 >82.95 mL/min  Microalbumin / creatinine urine ratio  Result Value Ref Range   Microalb, Ur <0.7 0.0 - 1.9 mg/dL   Creatinine,U 621.3 mg/dL   Microalb Creat Ratio 0.5 0.0 - 30.0 mg/g    Assessment and Plan:   Breylon was seen today for hospitalization follow-up.  Diagnoses and all orders for this visit:  Hospital discharge follow-up Medication reconciliation:  [x]   Medication list updated [x]   New medication list given to patient/family/caregiver  Referrals: [x]   None needed []   Referrals made to:   Community resources identified for patient/family:  [x]   None needed  []   Home health agency []   Assisted living  []   Hospice  []   Support group  []   Education program  Durable medical equipment ordered:  [x]   None needed  []   DME ordered:   Additional communication delivered or planned:  []   Family/Caregiver:  []   Specialists:  []   Other:  Patient education: Topics discussed: AS ABOVE Handouts given: SEE AVS  Initial transitional care contact was made on 05/04/18 (see separate note).  [place] was seen today for hospitalization follow-up.  Diagnoses and all orders for this visit:  Hospital discharge follow-up  Diarrhea of presumed infectious origin -     CBC with Differential/Platelet -     Comprehensive metabolic panel -     Microalbumin / creatinine urine ratio  Personal history of sepsis  Essential hypertension Comments: See AVS.   . Reviewed expectations re: course of current medical issues. . Discussed self-management of symptoms. . Outlined signs and symptoms indicating  need for more acute intervention. . Patient verbalized understanding and all questions were answered. Marland Kitchen Health Maintenance issues including appropriate healthy diet, exercise, and smoking avoidance were discussed with patient. . See orders for this visit as documented in the electronic medical record. . Patient received an After Visit Summary.  CMA served as Neurosurgeon during this visit. History, Physical, and Plan performed by medical provider. The above documentation has been reviewed and is accurate and complete. Louis Watson, D.O.  Louis Rima, DO [place], [facility] 05/29/2018

## 2018-05-11 LAB — CBC WITH DIFFERENTIAL/PLATELET
Basophils Absolute: 0.1 10*3/uL (ref 0.0–0.1)
Basophils Relative: 1.3 % (ref 0.0–3.0)
Eosinophils Absolute: 0.1 10*3/uL (ref 0.0–0.7)
Eosinophils Relative: 1.4 % (ref 0.0–5.0)
HCT: 44 % (ref 39.0–52.0)
Hemoglobin: 15.3 g/dL (ref 13.0–17.0)
Lymphocytes Relative: 31.4 % (ref 12.0–46.0)
Lymphs Abs: 2 10*3/uL (ref 0.7–4.0)
MCHC: 34.8 g/dL (ref 30.0–36.0)
MCV: 95.6 fl (ref 78.0–100.0)
Monocytes Absolute: 0.7 10*3/uL (ref 0.1–1.0)
Monocytes Relative: 11.2 % (ref 3.0–12.0)
Neutro Abs: 3.5 10*3/uL (ref 1.4–7.7)
Neutrophils Relative %: 54.7 % (ref 43.0–77.0)
Platelets: 294 10*3/uL (ref 150.0–400.0)
RBC: 4.6 Mil/uL (ref 4.22–5.81)
RDW: 12.9 % (ref 11.5–15.5)
WBC: 6.3 10*3/uL (ref 4.0–10.5)

## 2018-05-11 LAB — COMPREHENSIVE METABOLIC PANEL
ALT: 81 U/L — ABNORMAL HIGH (ref 0–53)
AST: 37 U/L (ref 0–37)
Albumin: 4 g/dL (ref 3.5–5.2)
Alkaline Phosphatase: 55 U/L (ref 39–117)
BUN: 18 mg/dL (ref 6–23)
CO2: 23 mEq/L (ref 19–32)
Calcium: 9.2 mg/dL (ref 8.4–10.5)
Chloride: 103 mEq/L (ref 96–112)
Creatinine, Ser: 1.16 mg/dL (ref 0.40–1.50)
GFR: 67.57 mL/min (ref >60.00)
Glucose, Bld: 106 mg/dL — ABNORMAL HIGH (ref 70–99)
Potassium: 4.1 mEq/L (ref 3.5–5.1)
Sodium: 135 mEq/L (ref 135–145)
Total Bilirubin: 0.8 mg/dL (ref 0.2–1.2)
Total Protein: 7.3 g/dL (ref 6.0–8.3)

## 2018-05-11 LAB — MICROALBUMIN / CREATININE URINE RATIO
Creatinine,U: 142.5 mg/dL
Microalb Creat Ratio: 0.5 mg/g (ref 0.0–30.0)
Microalb, Ur: <0.7 mg/dL (ref 0.0–1.9)

## 2018-05-21 ENCOUNTER — Encounter: Payer: Self-pay | Admitting: Family Medicine

## 2018-05-22 DIAGNOSIS — G4733 Obstructive sleep apnea (adult) (pediatric): Secondary | ICD-10-CM | POA: Diagnosis not present

## 2018-06-21 DIAGNOSIS — H1851 Endothelial corneal dystrophy: Secondary | ICD-10-CM | POA: Diagnosis not present

## 2018-06-21 DIAGNOSIS — H2513 Age-related nuclear cataract, bilateral: Secondary | ICD-10-CM | POA: Diagnosis not present

## 2018-06-22 DIAGNOSIS — G4733 Obstructive sleep apnea (adult) (pediatric): Secondary | ICD-10-CM | POA: Diagnosis not present

## 2018-07-07 DIAGNOSIS — Z23 Encounter for immunization: Secondary | ICD-10-CM | POA: Diagnosis not present

## 2018-07-22 DIAGNOSIS — G4733 Obstructive sleep apnea (adult) (pediatric): Secondary | ICD-10-CM | POA: Diagnosis not present

## 2018-07-26 DIAGNOSIS — H1851 Endothelial corneal dystrophy: Secondary | ICD-10-CM | POA: Diagnosis not present

## 2018-07-26 DIAGNOSIS — H2513 Age-related nuclear cataract, bilateral: Secondary | ICD-10-CM | POA: Diagnosis not present

## 2018-08-22 DIAGNOSIS — G4733 Obstructive sleep apnea (adult) (pediatric): Secondary | ICD-10-CM | POA: Diagnosis not present

## 2018-09-01 ENCOUNTER — Other Ambulatory Visit: Payer: Self-pay | Admitting: Family Medicine

## 2018-09-01 DIAGNOSIS — G4700 Insomnia, unspecified: Secondary | ICD-10-CM

## 2018-09-01 NOTE — Telephone Encounter (Signed)
Copied from CRM [id number]. Topic: Quick Communication - Rx Refill/Question >> Sep 01, 2018  3:09 PM Arlyss Gandy, NT wrote: Medication: eszopiclone (LUNESTA) 2 MG TABS tablet pt requesting 5 month supply and augmented betamethasone dipropionate (DIPROLENE-AF) 0.05 % ointment  Has the patient contacted their pharmacy? Yes.   (Agent: If no, request that the patient contact the pharmacy for the refill.) (Agent: If yes, when and what did the pharmacy advise?)  Preferred Pharmacy (with phone number or street name[facility] [address] [place] - [address] [place] [phone] (Phone) [phone] (Fax)    Agent: Please be advised that RX refills may take up to 3 business days. We ask that you follow-up with your pharmacy.

## 2018-09-01 NOTE — Telephone Encounter (Signed)
Requested medication (s) are due for refill today: yes to both  Requested medication (s) are on the active medication list: yes but Diprolene is an historical med  Last refill:  Lunesta: 02/27/18      Diprolene:06/17/17  Future visit scheduled: no  Notes to clinic:  lunesta is not delegated for NT to fill                           Diprolene is a histoical medication and provider   Requested Prescriptions  Pending Prescriptions Disp Refills   eszopiclone (LUNESTA) 2 MG TABS tablet 30 tablet 5    Sig: TAKE 1 TABLET BY MOUTH AT BEDTIME AS NEEDED FOR SLEEP *TAKE IMMEDIATELY BEFORE BED**     Not Delegated - Psychiatry:  Anxiolytics/Hypnotics Failed - 09/01/2018  3:15 PM      Failed - This refill cannot be delegated      Failed - Urine Drug Screen completed in last 360 days.      Passed - Valid encounter within last 6 months    Recent Outpatient Visits          3 months ago Hospital discharge follow-up   [facility] [place], [place], [place]   4 months ago Diarrhea of presumed infectious origin   [facility] [place], [place], [place]   5 months ago Chronic left-sided low back pain without sciatica   [facility] [address] [place], [place], [place]   6 months ago Obstructive sleep apnea syndrome   [facility] [place], [place], [place]   10 months ago Dizziness   [facility] [place], [place], DO            augmented betamethasone dipropionate (DIPROLENE-AF) 0.05 % ointment 30 g 1    Sig: Apply 1 application topically daily as needed (rash).     Off-Protocol Failed - 09/01/2018  3:15 PM      Failed - Medication not assigned to a protocol, review manually.      Passed - Valid encounter within last 12 months    Recent Outpatient Visits          3 months ago Hospital discharge follow-up   [facility] [place], [place], DO   4 months ago Diarrhea of presumed infectious origin   [facility] [place], [place], [place]   5 months ago Chronic left-sided low back pain without sciatica   [facility] [address] [place], [place], [place]   6 months ago Obstructive sleep apnea syndrome    [facility] [place], [place], [place]   10 months ago Dizziness    [facility] [place], [place], [place]

## 2018-09-01 NOTE — Telephone Encounter (Signed)
See request °

## 2018-09-05 MED ORDER — ESZOPICLONE 2 MG PO TABS: 2.0000 mg | ORAL_TABLET | Freq: Every evening | ORAL | 4 refills | Status: DC | PRN

## 2018-09-05 MED ORDER — BETAMETHASONE DIPROPIONATE AUG 0.05 % EX OINT: 1.0000 "application " | TOPICAL_OINTMENT | Freq: Every day | CUTANEOUS | 1 refills | Status: DC | PRN

## 2018-09-05 NOTE — Telephone Encounter (Signed)
Last script 02/27/18 #30 5 rf  Last app 05/10/18  No f/u

## 2018-09-12 ENCOUNTER — Ambulatory Visit: Payer: BLUE CROSS/BLUE SHIELD

## 2018-09-14 ENCOUNTER — Ambulatory Visit (INDEPENDENT_AMBULATORY_CARE_PROVIDER_SITE_OTHER): Payer: BLUE CROSS/BLUE SHIELD

## 2018-09-14 DIAGNOSIS — Z23 Encounter for immunization: Secondary | ICD-10-CM | POA: Diagnosis not present

## 2018-09-14 NOTE — Progress Notes (Signed)
Per orders of Dr.Wolfe, injection 3 of Twinrix  given by Donnamarie Poag in right deltoid. Patient tolerated injection well. He has finished all three. [facility] is updated.

## 2018-09-22 DIAGNOSIS — G4733 Obstructive sleep apnea (adult) (pediatric): Secondary | ICD-10-CM | POA: Diagnosis not present

## 2018-09-26 DIAGNOSIS — L570 Actinic keratosis: Secondary | ICD-10-CM | POA: Diagnosis not present

## 2018-09-26 DIAGNOSIS — X32XXXD Exposure to sunlight, subsequent encounter: Secondary | ICD-10-CM | POA: Diagnosis not present

## 2018-09-26 DIAGNOSIS — L308 Other specified dermatitis: Secondary | ICD-10-CM | POA: Diagnosis not present

## 2018-10-24 DIAGNOSIS — G4733 Obstructive sleep apnea (adult) (pediatric): Secondary | ICD-10-CM | POA: Diagnosis not present

## 2019-01-03 DIAGNOSIS — H2513 Age-related nuclear cataract, bilateral: Secondary | ICD-10-CM | POA: Diagnosis not present

## 2019-01-03 DIAGNOSIS — Z79899 Other long term (current) drug therapy: Secondary | ICD-10-CM | POA: Diagnosis not present

## 2019-01-03 DIAGNOSIS — F17290 Nicotine dependence, other tobacco product, uncomplicated: Secondary | ICD-10-CM | POA: Diagnosis not present

## 2019-01-03 DIAGNOSIS — H2512 Age-related nuclear cataract, left eye: Secondary | ICD-10-CM | POA: Diagnosis not present

## 2019-01-03 DIAGNOSIS — H1851 Endothelial corneal dystrophy: Secondary | ICD-10-CM | POA: Diagnosis not present

## 2019-01-04 DIAGNOSIS — Z1159 Encounter for screening for other viral diseases: Secondary | ICD-10-CM | POA: Diagnosis not present

## 2019-01-09 DIAGNOSIS — H1851 Endothelial corneal dystrophy: Secondary | ICD-10-CM | POA: Diagnosis not present

## 2019-01-09 DIAGNOSIS — H2512 Age-related nuclear cataract, left eye: Secondary | ICD-10-CM | POA: Diagnosis not present

## 2019-01-09 DIAGNOSIS — H25812 Combined forms of age-related cataract, left eye: Secondary | ICD-10-CM | POA: Diagnosis not present

## 2019-01-18 ENCOUNTER — Other Ambulatory Visit: Payer: Self-pay

## 2019-01-18 ENCOUNTER — Ambulatory Visit: Payer: BLUE CROSS/BLUE SHIELD | Admitting: Family Medicine

## 2019-01-18 ENCOUNTER — Encounter: Payer: Self-pay | Admitting: Family Medicine

## 2019-01-18 ENCOUNTER — Ambulatory Visit (INDEPENDENT_AMBULATORY_CARE_PROVIDER_SITE_OTHER): Payer: BLUE CROSS/BLUE SHIELD | Admitting: Family Medicine

## 2019-01-18 VITALS — BP 120/62 | HR 69 | Temp 99.0°F | Ht 67.0 in | Wt 280.0 lb

## 2019-01-18 DIAGNOSIS — S30860A Insect bite (nonvenomous) of lower back and pelvis, initial encounter: Secondary | ICD-10-CM

## 2019-01-18 DIAGNOSIS — W57XXXA Bitten or stung by nonvenomous insect and other nonvenomous arthropods, initial encounter: Secondary | ICD-10-CM | POA: Diagnosis not present

## 2019-01-18 DIAGNOSIS — S20462A Insect bite (nonvenomous) of left back wall of thorax, initial encounter: Secondary | ICD-10-CM

## 2019-01-18 NOTE — Progress Notes (Signed)
   Louis Watson is a 63 y.o. male here for an acute visit.  History of Present Illness:   HPI: Black spot on back, cannot see but well. Somewhat tender. No skin changes otherwise. Recent eye surgery, reviewed in chart and with patient.   PMHx, SurgHx, SocialHx, Medications, and Allergies were reviewed in the Visit Navigator and updated as appropriate.  Current Medications   Current Outpatient Medications:  .  aspirin EC 81 MG tablet, Take 81 mg by mouth daily., Disp: , Rfl:  .  augmented betamethasone dipropionate (DIPROLENE-AF) 0.05 % ointment, Apply 1 application topically daily as needed (rash)., Disp: 30 g, Rfl: 1 .  eszopiclone (LUNESTA) 2 MG TABS tablet, Take 1 tablet (2 mg total) by mouth at bedtime as needed for sleep., Disp: 60 tablet, Rfl: 4 .  sildenafil (REVATIO) 20 MG tablet, 5 po x 1, take 0.5 to 4 hours prior to sexual activity (Patient taking differently: Take 20 mg by mouth as needed. 4 hours prior to sexual activity), Disp: 30 tablet, Rfl: 5 .  amLODipine (NORVASC) 5 MG tablet, Take 5 mg by mouth daily., Disp: , Rfl:    Allergies  Allergen Reactions  . Penicillins Rash   Review of Systems   Pertinent items are noted in the HPI. Otherwise, ROS is negative.  Vitals   Vitals:   01/18/19 1436  BP: 120/62  Pulse: 69  Temp: 99 F (37.2 C)  TempSrc: Oral  SpO2: 96%  Weight: 280 lb (127 kg)  Height: 5\' 7"  (1.702 m)     Body mass index is 43.85 kg/m.  Physical Exam   Physical Exam Skin:    Comments: Attached live tick on upper left back.     Assessment and Plan   Louis Watson was seen today for skin problem.  Diagnoses and all orders for this visit:  Tick bite of back, initial encounter Comments: Removed tick in usual manner with no complications. Precations reviewed.     . Reviewed expectations re: course of current medical issues. . Discussed self-management of symptoms. . Outlined signs and symptoms indicating need for more acute  intervention. . Patient verbalized understanding and all questions were answered. Marland Kitchen Health Maintenance issues including appropriate healthy diet, exercise, and smoking avoidance were discussed with patient. . See orders for this visit as documented in the electronic medical record. . Patient received an After Visit Summary.  Helane Rima, DO [place], [facility] 01/19/2019

## 2019-01-19 ENCOUNTER — Encounter: Payer: Self-pay | Admitting: Family Medicine

## 2019-02-15 ENCOUNTER — Other Ambulatory Visit: Payer: Self-pay

## 2019-02-15 ENCOUNTER — Ambulatory Visit (INDEPENDENT_AMBULATORY_CARE_PROVIDER_SITE_OTHER): Payer: BC Managed Care – PPO | Admitting: Family Medicine

## 2019-02-15 ENCOUNTER — Encounter: Payer: Self-pay | Admitting: Family Medicine

## 2019-02-15 DIAGNOSIS — L03116 Cellulitis of left lower limb: Secondary | ICD-10-CM

## 2019-02-15 MED ORDER — DOXYCYCLINE HYCLATE 100 MG PO TABS: 100.0000 mg | ORAL_TABLET | Freq: Two times a day (BID) | ORAL | 0 refills | Status: DC

## 2019-02-15 NOTE — Progress Notes (Signed)
Virtual Visit via Video Note  I connected with Louis Watson  on 02/15/19 at 10:40 AM EDT by a video enabled telemedicine application and verified that I am speaking with the correct person using two identifiers.  Location patient: home Location provider:work or home office Persons participating in the virtual visit: patient, provider  I discussed the limitations of evaluation and management by telemedicine and the availability of in person appointments. The patient expressed understanding and agreed to proceed.   HPI:  Acute visit for a rash: -history of eczema, has several steroid creams and lotions available. -about 3 days ago noticed a spot on his L ant shin - thought was eczema -however, has gradually enlarged some and is red and somewhat painful -has a history of cellulitis in this area -denies fevers (temp was checked on the way into work just prior to this visit), headache, malaise -feels mildly off - but reports is mild, he wondered if he had a fever, but temp was normal  ROS: See pertinent positives and negatives per HPI.  Past Medical History:  Diagnosis Date  . Diverticulosis 01/17/2017  . Hx of lumbar discectomy 01/17/2017  . Hypertension   . OSA on CPAP    setting = 8  . Pancreatitis 2013  . Plantar fasciitis     Past Surgical History:  Procedure Laterality Date  . KNEE ARTHROSCOPY  2013  . LUMBAR DISC SURGERY  1993    Family History  Problem Relation Age of Onset  . Congestive Heart Failure Mother     SOCIAL HX: see hpi   Current Outpatient Medications:  .  amLODipine (NORVASC) 5 MG tablet, Take 5 mg by mouth daily., Disp: , Rfl:  .  aspirin EC 81 MG tablet, Take 81 mg by mouth daily., Disp: , Rfl:  .  augmented betamethasone dipropionate (DIPROLENE-AF) 0.05 % ointment, Apply 1 application topically daily as needed (rash)., Disp: 30 g, Rfl: 1 .  eszopiclone (LUNESTA) 2 MG TABS tablet, Take 1 tablet (2 mg total) by mouth at bedtime as needed for sleep., Disp:  60 tablet, Rfl: 4 .  sildenafil (REVATIO) 20 MG tablet, 5 po x 1, take 0.5 to 4 hours prior to sexual activity (Patient taking differently: Take 20 mg by mouth as needed. 4 hours prior to sexual activity), Disp: 30 tablet, Rfl: 5 .  doxycycline (VIBRA-TABS) 100 MG tablet, Take 1 tablet (100 mg total) by mouth 2 (two) times daily., Disp: 10 tablet, Rfl: 0  EXAM:  VITALS per patient if applicable: Denies fever  GENERAL: alert, oriented, appears well and in no acute distress  HEENT: atraumatic, conjunttiva clear, no obvious abnormalities on inspection of external nose and ears  NECK: normal movements of the head and neck  LUNGS: on inspection no signs of respiratory distress, breathing rate appears normal, no obvious gross SOB, gasping or wheezing  CV: no obvious cyanosis  SKIN: fairly demarcated irr shaped area of erythema and mild edema on the ant shin approximately the size of his hand. No purulence or streaking.  MS: moves all visible extremities without noticeable abnormality  PSYCH/NEURO: pleasant and cooperative, no obvious depression or anxiety, speech and thought processing grossly intact  ASSESSMENT AND PLAN:  Discussed the following assessment and plan:  Cellulitis of  lower extremity   Discussed potential etiologies, options for evaluation, treatment options and risks. We decided to start antibiotic treatment with Doxy100mg  bid x 5 days. He agrees to mark the area and closely observe. He agrees to seek prompt medical  care if is worsening, fevers, malaise or other symptoms develop or if this is not improving over the next 24-48 hours.    I discussed the assessment and treatment plan with the patient. The patient was provided an opportunity to ask questions and all were answered. The patient agreed with the plan and demonstrated an understanding of the instructions.      Terressa KoyanagiHannah R Saidah Kempton, DO

## 2019-02-15 NOTE — Patient Instructions (Signed)
Take the antibiotic (Doxycycline) as prescribed.  I hope you are feeling better soon! Seek care promptly if your symptoms worsen, new concerns arise or you are not improving with treatment.

## 2019-02-16 DIAGNOSIS — Z20828 Contact with and (suspected) exposure to other viral communicable diseases: Secondary | ICD-10-CM | POA: Diagnosis not present

## 2019-03-05 ENCOUNTER — Other Ambulatory Visit: Payer: Self-pay

## 2019-03-05 ENCOUNTER — Telehealth: Payer: Self-pay | Admitting: Family Medicine

## 2019-03-05 MED ORDER — AMLODIPINE BESYLATE 5 MG PO TABS: 5.0000 mg | ORAL_TABLET | Freq: Every day | ORAL | 0 refills | Status: DC

## 2019-03-05 NOTE — Telephone Encounter (Signed)
See note

## 2019-03-05 NOTE — Telephone Encounter (Signed)
Medication Refill - Medication:amLODipine (NORVASC) 5 MG tablet only 7 pills pt is out of town and has ran out of medication.  Has the patient contacted their pharmacy?Yes (Agent: If no, request that the patient contact the pharmacy for the refill.) (Agent: If yes, when and what did the pharmacy advise?)  [facility] [address], [place] [address] Phone: [phone]  Agent: Please be advised that RX refills may take up to 3 business days. We ask that you follow-up with your pharmacy.

## 2019-03-05 NOTE — Telephone Encounter (Signed)
Medication sent to preferred pharmacy

## 2019-03-06 ENCOUNTER — Other Ambulatory Visit: Payer: Self-pay

## 2019-03-06 ENCOUNTER — Telehealth: Payer: Self-pay | Admitting: Family Medicine

## 2019-03-06 DIAGNOSIS — G4700 Insomnia, unspecified: Secondary | ICD-10-CM

## 2019-03-06 MED ORDER — ESZOPICLONE 2 MG PO TABS: 2.0000 mg | ORAL_TABLET | Freq: Every evening | ORAL | 4 refills | Status: DC | PRN

## 2019-03-06 NOTE — Telephone Encounter (Signed)
Had  Prescription sent to pharmacy in [place].

## 2019-03-06 NOTE — Telephone Encounter (Signed)
Pt called and stated that medication has been sent to wrong pharmacy

## 2019-03-06 NOTE — Telephone Encounter (Signed)
Copied from [place] [id number]. Topic: Quick Communication - Rx Refill/Question >> Mar 06, 2019  1:45 PM Leward Quan A wrote: Medication: eszopiclone (LUNESTA) 2 MG TABS tablet  Patient asked can this please be sent today because he used the last one on 03/05/2019  Has the patient contacted their pharmacy? Yes.   (Agent: If no, request that the patient contact the pharmacy for the refill.) (Agent: If yes, when and what did the pharmacy advise?)  Preferred Pharm[facility] (with phone number or street name[id number] [place] [place] [phone] (Phone) [phone] (Fax)    Agent: Please be advised that RX refills may take up to 3 business days. We ask that you follow-up with your pharmacy.

## 2019-03-07 ENCOUNTER — Other Ambulatory Visit: Payer: Self-pay | Admitting: Family Medicine

## 2019-03-07 ENCOUNTER — Other Ambulatory Visit: Payer: Self-pay

## 2019-03-07 DIAGNOSIS — G4700 Insomnia, unspecified: Secondary | ICD-10-CM

## 2019-03-07 MED ORDER — ESZOPICLONE 2 MG PO TABS: 2.0000 mg | ORAL_TABLET | Freq: Every evening | ORAL | 4 refills | Status: DC | PRN

## 2019-04-16 DIAGNOSIS — G4733 Obstructive sleep apnea (adult) (pediatric): Secondary | ICD-10-CM | POA: Diagnosis not present

## 2019-05-05 ENCOUNTER — Other Ambulatory Visit: Payer: Self-pay | Admitting: Family Medicine

## 2019-05-07 NOTE — Telephone Encounter (Signed)
Last fill 09/05/18  #30g/1 Last OV 01/18/19

## 2019-05-09 DIAGNOSIS — H26492 Other secondary cataract, left eye: Secondary | ICD-10-CM | POA: Diagnosis not present

## 2019-05-11 ENCOUNTER — Other Ambulatory Visit: Payer: Self-pay | Admitting: Family Medicine

## 2019-05-11 DIAGNOSIS — R7303 Prediabetes: Secondary | ICD-10-CM

## 2019-05-16 DIAGNOSIS — G4733 Obstructive sleep apnea (adult) (pediatric): Secondary | ICD-10-CM | POA: Diagnosis not present

## 2019-06-04 DIAGNOSIS — N5082 Scrotal pain: Secondary | ICD-10-CM | POA: Diagnosis not present

## 2019-06-07 DIAGNOSIS — N432 Other hydrocele: Secondary | ICD-10-CM | POA: Diagnosis not present

## 2019-06-07 DIAGNOSIS — N5082 Scrotal pain: Secondary | ICD-10-CM | POA: Diagnosis not present

## 2019-06-09 ENCOUNTER — Other Ambulatory Visit: Payer: Self-pay | Admitting: Family Medicine

## 2019-06-09 DIAGNOSIS — N529 Male erectile dysfunction, unspecified: Secondary | ICD-10-CM

## 2019-06-15 DIAGNOSIS — G4733 Obstructive sleep apnea (adult) (pediatric): Secondary | ICD-10-CM | POA: Diagnosis not present

## 2019-07-05 ENCOUNTER — Encounter: Payer: Self-pay | Admitting: Podiatry

## 2019-07-05 ENCOUNTER — Ambulatory Visit (INDEPENDENT_AMBULATORY_CARE_PROVIDER_SITE_OTHER): Payer: BC Managed Care – PPO

## 2019-07-05 ENCOUNTER — Ambulatory Visit (INDEPENDENT_AMBULATORY_CARE_PROVIDER_SITE_OTHER): Payer: BC Managed Care – PPO | Admitting: Podiatry

## 2019-07-05 ENCOUNTER — Other Ambulatory Visit: Payer: Self-pay

## 2019-07-05 DIAGNOSIS — M722 Plantar fascial fibromatosis: Secondary | ICD-10-CM

## 2019-07-10 NOTE — Progress Notes (Signed)
Subjective:   Patient ID: Louis Watson, male   DOB: 63 y.o.   MRN: [id number]   HPI Patient presents with a long-term history of inflammation of the plantar fascia stating is done well with orthotics in the past but needs a new pair   ROS      Objective:  Physical Exam  Neurovascular status intact with patient noted to have moderate depression of the arch bilateral with inflammation of the mid arch area and into the plantar fascial bilateral with pain with palpation     Assessment:  Fasciitis-like symptoms bilateral with pain that has been present with orthotics which control symptoms     Plan:  H&P conditions reviewed and I recommended orthotics and discussed orthotics with him and he is casted for new orthotics.  Will be seen back when ready  X-rays indicate that there is moderate depression of the arch no indication of spur formation heel region of a significant change from previous visit

## 2019-07-16 DIAGNOSIS — G4733 Obstructive sleep apnea (adult) (pediatric): Secondary | ICD-10-CM | POA: Diagnosis not present

## 2019-07-23 ENCOUNTER — Other Ambulatory Visit: Payer: Self-pay | Admitting: Family Medicine

## 2019-07-23 DIAGNOSIS — N529 Male erectile dysfunction, unspecified: Secondary | ICD-10-CM

## 2019-07-23 DIAGNOSIS — L304 Erythema intertrigo: Secondary | ICD-10-CM | POA: Diagnosis not present

## 2019-07-23 DIAGNOSIS — Z23 Encounter for immunization: Secondary | ICD-10-CM | POA: Diagnosis not present

## 2019-07-23 DIAGNOSIS — L570 Actinic keratosis: Secondary | ICD-10-CM | POA: Diagnosis not present

## 2019-07-23 DIAGNOSIS — N5082 Scrotal pain: Secondary | ICD-10-CM | POA: Diagnosis not present

## 2019-07-26 DIAGNOSIS — Z20828 Contact with and (suspected) exposure to other viral communicable diseases: Secondary | ICD-10-CM | POA: Diagnosis not present

## 2019-07-30 ENCOUNTER — Other Ambulatory Visit: Payer: Self-pay

## 2019-07-30 ENCOUNTER — Ambulatory Visit (INDEPENDENT_AMBULATORY_CARE_PROVIDER_SITE_OTHER): Payer: BC Managed Care – PPO | Admitting: Physician Assistant

## 2019-07-30 ENCOUNTER — Encounter: Payer: Self-pay | Admitting: Physician Assistant

## 2019-07-30 VITALS — Temp 99.0°F | Ht 67.0 in | Wt 279.0 lb

## 2019-07-30 DIAGNOSIS — J029 Acute pharyngitis, unspecified: Secondary | ICD-10-CM

## 2019-07-30 MED ORDER — AZITHROMYCIN 250 MG PO TABS: ORAL_TABLET | ORAL | 0 refills | Status: DC

## 2019-07-30 NOTE — Progress Notes (Addendum)
Virtual Visit via Video   I connected with Anuj Summons on 07/30/19 at 11:00 AM EST by a video enabled telemedicine application and verified that I am speaking with the correct person using two identifiers. Location patient: Home Location provider: [facility], Office Persons participating in the virtual visit: Anis Degidio, Inda Coke PA-C, Anselmo Pickler, LPN   I discussed the limitations of evaluation and management by telemedicine and the availability of in person appointments. The patient expressed understanding and agreed to proceed.  I acted as a Education administrator for [employer], PA-C [employer], LPN  Subjective:   HPI:   Patient is requesting evaluation for possible URI symptoms.  Symptom onset: Last Monday; significantly worsened Wednesday night. Rapid COVID-19 test on 07/26/19 and was negative.  Travel/contacts: No  Patient endorses the following symptoms: Fever (99-100), subjective fever, sinus headache, sinus congestion, rhinorrhea, ear pain, sore throat and difficulty swallowing   Worst symptom is sore throat and R ear pain.    Patient denies the following symptoms: ear drainage and shortness of breath; cough; chest pain; sob, loss of taste/smell, diarrhea, abdominal pain; malaise; inability to consume po's  Treatments tried: Tylenol and Motrin  Patient risk factors: Current IFOYD-74 risk of complications score: 4 Smoking status: Damaso Laday  reports that he has been smoking cigars. He has never used smokeless tobacco. If male, currently pregnant? []   Yes [x]   No  ROS: See pertinent positives and negatives per HPI.  Patient Active Problem List   Diagnosis Date Noted  . Cellulitis and abscess of foot   . Febrile illness   . FUO (fever of unknown origin)   . Hypotension   . Sepsis ([place]) 05/01/2018  . Dizziness 10/25/2017  . Eczema 06/17/2017  . Partial tear of right subscapularis tendon 01/25/2017  . Hx of pancreatitis 01/17/2017  . Hx of  lumbar discectomy 01/17/2017  . Diverticulosis 01/17/2017  . Erectile dysfunction 01/15/2017  . Chronic right shoulder pain 01/15/2017  . Bursitis of shoulder 09/10/2015  . Hypertension 10/29/2014  . Insomnia 10/29/2014  . Sleep apnea 10/29/2014    Social History   Tobacco Use  . Smoking status: Current Every Day Smoker    Types: Cigars  . Smokeless tobacco: Never Used  Substance Use Topics  . Alcohol use: No    Current Outpatient Medications:  .  amLODipine (NORVASC) 5 MG tablet, TAKE 1 TABLET BY MOUTH EVERY DAY, Disp: 90 tablet, Rfl: 3 .  aspirin EC 81 MG tablet, Take 81 mg by mouth daily., Disp: , Rfl:  .  augmented betamethasone dipropionate (DIPROLENE-AF) 0.05 % ointment, APPLY 1 APPLICATION TOPICALLY DAILY AS NEEDED (RASH)., Disp: 15 g, Rfl: 3 .  eszopiclone (LUNESTA) 2 MG TABS tablet, Take 1 tablet (2 mg total) by mouth at bedtime as needed for sleep., Disp: 60 tablet, Rfl: 4 .  fluticasone (CUTIVATE) 0.05 % cream, APPLY TO AFFECTED AREA FACE/UNDERARMS TWICE A DAY AS NEEDED, Disp: , Rfl:  .  ibuprofen (ADVIL) 600 MG tablet, Take 600 mg by mouth 3 (three) times daily., Disp: , Rfl:  .  ketoconazole (NIZORAL) 2 % cream, APPLY TO AFFECTED AREA EVERY DAY FOR 14 DAYS, Disp: , Rfl:  .  prednisoLONE acetate (PRED FORTE) 1 % ophthalmic suspension, 1 drop 4 (four) times daily., Disp: , Rfl:  .  sildenafil (REVATIO) 20 MG tablet, Take 1 tablet (20 mg total) by mouth as needed. 4 hours prior to sexual activity, Disp: 30 tablet, Rfl: 0  Allergies  Allergen Reactions  .  Penicillins Rash    Objective:   VITALS: Per patient if applicable, see vitals. GENERAL: Alert, appears well and in no acute distress. HEENT: Atraumatic, conjunctiva clear, no obvious abnormalities on inspection of external nose and ears. NECK: Normal movements of the head and neck. CARDIOPULMONARY: No increased WOB. Speaking in clear sentences. I:E ratio WNL.  MS: Moves all visible extremities without noticeable  abnormality. PSYCH: Pleasant and cooperative, well-groomed. Speech normal rate and rhythm. Affect is appropriate. Insight and judgement are appropriate. Attention is focused, linear, and appropriate.  NEURO: CN grossly intact. Oriented as arrived to appointment on time with no prompting. Moves both UE equally.  SKIN: No obvious lesions, wounds, erythema, or cyanosis noted on face or hands.  Assessment and Plan:   Haitham was seen today for covid symptoms.  Diagnoses and all orders for this visit:  Pharyngitis, unspecified etiology   Patient has a respiratory illness without signs of acute distress or respiratory compromise at this time.   Will initiate oral azithromycin to empirically cover for strep and possible bacterial sinusitis, patient is PCN allergic.   Advised if they experience a "second sickening" or worsening symptoms as the illness progresses, they are to call the office for further instructions or seek emergent evaluation for any severe symptoms.   . Reviewed expectations re: course of current medical issues. . Discussed self-management of symptoms. . Outlined signs and symptoms indicating need for more acute intervention. . Patient verbalized understanding and all questions were answered. Marland Kitchen Health Maintenance issues including appropriate healthy diet, exercise, and smoking avoidance were discussed with patient. . See orders for this visit as documented in the electronic medical record.  I discussed the assessment and treatment plan with the patient. The patient was provided an opportunity to ask questions and all were answered. The patient agreed with the plan and demonstrated an understanding of the instructions.   The patient was advised to call back or seek an in-person evaluation if the symptoms worsen or if the condition fails to improve as anticipated.   CMA or LPN served as scribe during this visit. History, Physical, and Plan performed by medical provider. The above  documentation has been reviewed and is accurate and complete.   [place], [place] 07/30/2019

## 2019-08-03 ENCOUNTER — Other Ambulatory Visit: Payer: Self-pay

## 2019-08-03 DIAGNOSIS — N529 Male erectile dysfunction, unspecified: Secondary | ICD-10-CM

## 2019-08-05 LAB — NOVEL CORONAVIRUS, NAA: SARS-CoV-2, NAA: NOT DETECTED

## 2019-08-06 ENCOUNTER — Telehealth: Payer: Self-pay | Admitting: Physician Assistant

## 2019-08-06 ENCOUNTER — Ambulatory Visit: Payer: BC Managed Care – PPO | Admitting: Orthotics

## 2019-08-06 ENCOUNTER — Other Ambulatory Visit: Payer: Self-pay | Admitting: Physician Assistant

## 2019-08-06 ENCOUNTER — Other Ambulatory Visit: Payer: Self-pay

## 2019-08-06 DIAGNOSIS — M722 Plantar fascial fibromatosis: Secondary | ICD-10-CM

## 2019-08-06 MED ORDER — NYSTATIN 100000 UNIT/ML MT SUSP: 5.0000 mL | Freq: Four times a day (QID) | OROMUCOSAL | 0 refills | Status: DC

## 2019-08-06 NOTE — Progress Notes (Signed)
Patient came in today to pick up custom made foot orthotics.  The goals were accomplished and the patient reported no dissatisfaction with said orthotics.  Patient was advised of breakin period and how to report any issues. 

## 2019-08-06 NOTE — Telephone Encounter (Signed)
Please see message and advise. Pt seen 07/30/2019 for Pharyngitis.

## 2019-08-06 NOTE — Telephone Encounter (Signed)
Nystatin suspension sent in for him to trial.  If no improvement, needs appointment.

## 2019-08-06 NOTE — Telephone Encounter (Signed)
Contact patient to advise. Patient was seen on 12/7  Copied from [place] [id number]. Topic: General - Other >> Aug 06, 2019  2:21 PM Wynetta Emery, [place] C wrote: Reason for CRM: pt called in to be advised. Pt says that he was seen and prescribed a Z-pak, pt says that he has now stating to notice that his tongue is turning white, pt feels that it is thrush. Pt would like to be advised further.    CB: [phone]

## 2019-08-06 NOTE — Telephone Encounter (Signed)
Spoke to pt told him Rx for Nystatin suspension has been sent to the pharmacy. If no improvement need to make an appt per [facility]. Pt verbalized understanding.

## 2019-08-07 ENCOUNTER — Telehealth: Payer: Self-pay | Admitting: Physician Assistant

## 2019-08-07 MED ORDER — FLUCONAZOLE 150 MG PO TABS: 150.0000 mg | ORAL_TABLET | Freq: Once | ORAL | 0 refills | Status: AC

## 2019-08-07 NOTE — Telephone Encounter (Signed)
Please see message and advise 

## 2019-08-07 NOTE — Telephone Encounter (Signed)
May trial one time tablet of oral diflucan 150 mg (please send in for patient.) Further issues needs office visit.

## 2019-08-07 NOTE — Telephone Encounter (Signed)
Spoke to pt told him I am sending into pharmacy Diflucan one tablet to take, if symptoms do not improve need to make an appt. Pt verbalized understanding.

## 2019-08-07 NOTE — Telephone Encounter (Signed)
Patient called and said that he had a reaction to the medication nystatin (MYCOSTATIN) 100000 UNIT/ML suspension. He started coughing 30 sec of using it and he stopped taking it and also he started to itching all over his body. Patient is doing better since he stopped taking it. He wants to know if he continues taking it or if he can get something else. Please call patient back, thanks.

## 2019-08-07 NOTE — Telephone Encounter (Signed)
See note

## 2019-08-08 DIAGNOSIS — Z947 Corneal transplant status: Secondary | ICD-10-CM | POA: Diagnosis not present

## 2019-08-08 DIAGNOSIS — H2511 Age-related nuclear cataract, right eye: Secondary | ICD-10-CM | POA: Diagnosis not present

## 2019-08-08 DIAGNOSIS — H18519 Endothelial corneal dystrophy, unspecified eye: Secondary | ICD-10-CM | POA: Diagnosis not present

## 2019-08-08 DIAGNOSIS — Z9842 Cataract extraction status, left eye: Secondary | ICD-10-CM | POA: Diagnosis not present

## 2019-08-15 DIAGNOSIS — G4733 Obstructive sleep apnea (adult) (pediatric): Secondary | ICD-10-CM | POA: Diagnosis not present

## 2019-08-16 ENCOUNTER — Other Ambulatory Visit: Payer: Self-pay | Admitting: Physician Assistant

## 2019-08-16 DIAGNOSIS — N529 Male erectile dysfunction, unspecified: Secondary | ICD-10-CM

## 2019-08-16 NOTE — Telephone Encounter (Signed)
sildenafil (REVATIO) 20 MG tablet    Patient requesting refill until [facility] appointment.    [id number] [address], [place] Phone:  [phone]  Fax:  [phone]

## 2019-08-16 NOTE — Telephone Encounter (Signed)
Requested medication (s) are due for refill today:  Yes  Requested medication (s) are on the active medication list:  Yes  Future visit scheduled:  Yes - for TOC  Last Refill: 06/10/19; #30/ no refills  Requested Prescriptions  Pending Prescriptions Disp Refills   sildenafil (REVATIO) 20 MG tablet 30 tablet 0    Sig: Take 1 tablet (20 mg total) by mouth as needed. 4 hours prior to sexual activity      Urology: Erectile Dysfunction Agents Passed - 08/16/2019 12:52 PM      Passed - Last BP in normal range    BP Readings from Last 1 Encounters:  01/18/19 120/62          Passed - Valid encounter within last 12 months    Recent Outpatient Visits           2 weeks ago Pharyngitis, unspecified etiology   [facility] [place], [place], [place]   6 months ago Cellulitis of left lower extremity   [facility] [place], [place]   7 months ago Tick bite of back, initial encounter   [place], [place], DO   1 year ago Hospital discharge follow-up   [place], [place]   1 year ago Diarrhea of presumed infectious origin   [place], [place], [place]       Future Appointments             In 2 months Orma Flaming, MD [place], [facility]

## 2019-08-20 MED ORDER — SILDENAFIL CITRATE 20 MG PO TABS: 20.0000 mg | ORAL_TABLET | ORAL | 0 refills | Status: DC | PRN

## 2019-08-20 NOTE — Telephone Encounter (Signed)
Ok to fill 

## 2019-08-27 ENCOUNTER — Ambulatory Visit (INDEPENDENT_AMBULATORY_CARE_PROVIDER_SITE_OTHER): Payer: BC Managed Care – PPO | Admitting: Physician Assistant

## 2019-08-27 ENCOUNTER — Encounter: Payer: Self-pay | Admitting: Physician Assistant

## 2019-08-27 DIAGNOSIS — H5789 Other specified disorders of eye and adnexa: Secondary | ICD-10-CM | POA: Diagnosis not present

## 2019-08-27 MED ORDER — ERYTHROMYCIN 5 MG/GM OP OINT: TOPICAL_OINTMENT | OPHTHALMIC | 0 refills | Status: DC

## 2019-08-27 NOTE — Progress Notes (Signed)
Virtual Visit via Video   I connected with Louis Watson on 08/27/19 at  3:40 PM EST by a video enabled telemedicine application and verified that I am speaking with the correct person using two identifiers. Location patient: Home Location provider: [facility], Office Persons participating in the virtual visit: [place], Marchi PA-C  I discussed the limitations of evaluation and management by telemedicine and the availability of in person appointments. The patient expressed understanding and agreed to proceed.  Subjective:   HPI:   Conjunctivitis R eye irritation started last week. Area is red and tearful at times. Had slight blurry vision when he first woke up this morning. Had temp of 99.4 a few days ago. Possibly has developed some nasal congestion, but nothing significant. Trialed drops at home because he thought that it was possibly related to dry eyes but didn't find that this made it better.  Denies: crusting/matting in the AM, known trauma, double vision, severe eye pain, HA  ROS: See pertinent positives and negatives per HPI.  Patient Active Problem List   Diagnosis Date Noted  . Cellulitis and abscess of foot   . Febrile illness   . FUO (fever of unknown origin)   . Hypotension   . Sepsis ([place]) 05/01/2018  . Dizziness 10/25/2017  . Eczema 06/17/2017  . Partial tear of right subscapularis tendon 01/25/2017  . Hx of pancreatitis 01/17/2017  . Hx of lumbar discectomy 01/17/2017  . Diverticulosis 01/17/2017  . Erectile dysfunction 01/15/2017  . Chronic right shoulder pain 01/15/2017  . Bursitis of shoulder 09/10/2015  . Hypertension 10/29/2014  . Insomnia 10/29/2014  . Sleep apnea 10/29/2014    Social History   Tobacco Use  . Smoking status: Current Every Day Smoker    Types: Cigars  . Smokeless tobacco: Never Used  Substance Use Topics  . Alcohol use: No    Current Outpatient Medications:  .  amLODipine (NORVASC) 5 MG tablet, TAKE 1 TABLET  BY MOUTH EVERY DAY, Disp: 90 tablet, Rfl: 3 .  aspirin EC 81 MG tablet, Take 81 mg by mouth daily., Disp: , Rfl:  .  augmented betamethasone dipropionate (DIPROLENE-AF) 0.05 % ointment, APPLY 1 APPLICATION TOPICALLY DAILY AS NEEDED (RASH)., Disp: 15 g, Rfl: 3 .  eszopiclone (LUNESTA) 2 MG TABS tablet, Take 1 tablet (2 mg total) by mouth at bedtime as needed for sleep., Disp: 60 tablet, Rfl: 4 .  fluticasone (CUTIVATE) 0.05 % cream, APPLY TO AFFECTED AREA FACE/UNDERARMS TWICE A DAY AS NEEDED, Disp: , Rfl:  .  ibuprofen (ADVIL) 600 MG tablet, Take 600 mg by mouth 3 (three) times daily., Disp: , Rfl:  .  ketoconazole (NIZORAL) 2 % cream, APPLY TO AFFECTED AREA EVERY DAY FOR 14 DAYS, Disp: , Rfl:  .  prednisoLONE acetate (PRED FORTE) 1 % ophthalmic suspension, 1 drop 4 (four) times daily., Disp: , Rfl:  .  sildenafil (REVATIO) 20 MG tablet, Take 1 tablet (20 mg total) by mouth as needed. 4 hours prior to sexual activity, Disp: 30 tablet, Rfl: 0 .  erythromycin ophthalmic ointment, Apply to affected area 1-2 times daily., Disp: 3.5 g, Rfl: 0  Allergies  Allergen Reactions  . Nystatin Cough  . Penicillins Rash    Objective:   VITALS: Per patient if applicable, see vitals. GENERAL: Alert, appears well and in no acute distress. HEENT: Atraumatic, no obvious abnormalities on inspection of external nose and ears. Inner right eye conjunctiva injected; no swelling of eyelid noted, apparent normal ROM of  R eye on video NECK: Normal movements of the head and neck. CARDIOPULMONARY: No increased WOB. Speaking in clear sentences. I:E ratio WNL.  MS: Moves all visible extremities without noticeable abnormality. PSYCH: Pleasant and cooperative, well-groomed. Speech normal rate and rhythm. Affect is appropriate. Insight and judgement are appropriate. Attention is focused, linear, and appropriate.  NEURO: CN grossly intact. Oriented as arrived to appointment on time with no prompting. Moves both UE equally.   SKIN: No obvious lesions, wounds, erythema, or cyanosis noted on face or hands.  Assessment and Plan:   Lorry was seen today for conjunctivitis.  Diagnoses and all orders for this visit:  Redness of right eye  Other orders -     erythromycin ophthalmic ointment; Apply to affected area 1-2 times daily.   No red flags on discussion. Will cover for possible conjunctivitis with erythromycin ointment. Worsening precautions advised and recommended that if he worsens or has no improvement in 1-2 days to reach out to his eye doctor, or [place] if he is unable to get in with them.  . Reviewed expectations re: course of current medical issues. . Discussed self-management of symptoms. . Outlined signs and symptoms indicating need for more acute intervention. . Patient verbalized understanding and all questions were answered. Marland Kitchen Health Maintenance issues including appropriate healthy diet, exercise, and smoking avoidance were discussed with patient. . See orders for this visit as documented in the electronic medical record.  I discussed the assessment and treatment plan with the patient. The patient was provided an opportunity to ask questions and all were answered. The patient agreed with the plan and demonstrated an understanding of the instructions.   The patient was advised to call back or seek an in-person evaluation if the symptoms worsen or if the condition fails to improve as anticipated.  [place], [place] 08/27/2019

## 2019-08-29 DIAGNOSIS — H1131 Conjunctival hemorrhage, right eye: Secondary | ICD-10-CM | POA: Diagnosis not present

## 2019-09-06 ENCOUNTER — Telehealth: Payer: Self-pay | Admitting: Family Medicine

## 2019-09-06 DIAGNOSIS — G4700 Insomnia, unspecified: Secondary | ICD-10-CM

## 2019-09-06 NOTE — Telephone Encounter (Signed)
  LAST APPOINTMENT DATE: @@LASTENCT @  NEXT APPOINTMENT DATE:@2 /26/2021  MEDICATION: eszopiclone (LUNESTA) 2 MG TABS tablet  [id number] [place], [place] - [address] Phone:  [phone]  Fax:  [phone]       **Let patient know to contact pharmacy at the end of the day to make sure medication is ready. **  ** Please notify patient to allow 48-72 hours to process**  **Encourage patient to contact the pharmacy for refills or they can request refills through [facility]**  CLINICAL FILLS OUT ALL BELOW:   LAST REFILL:  QTY:  REFILL DATE:    OTHER COMMENTS:    Okay for refill?  Please advise

## 2019-09-07 MED ORDER — ESZOPICLONE 2 MG PO TABS: 2.0000 mg | ORAL_TABLET | Freq: Every evening | ORAL | 1 refills | Status: DC | PRN

## 2019-09-07 NOTE — Telephone Encounter (Signed)
Patient is following up on medicine refill request. Patient request refill by the end of today because he out. Pt is TOC appt is 10/19/19 at 9:40am

## 2019-09-07 NOTE — Addendum Note (Signed)
Addended by: Orland Mustard on: 09/07/2019 09:34 AM   Modules accepted: Orders

## 2019-09-07 NOTE — Telephone Encounter (Signed)
Patient called in yesterday wanting Dr.Wolfe to refill the Lunesta 2MG  because he was completely out, tried to explain to patient that Dr.Wolfe was out of the office and she would be the one to refill/approve of that medication. Patient says that his pharmacy has made several calls to about this medication and he was tired of calling about this, explained to patient that their was no call made to [place] from the pharmacy. He then started to get angry and was demanding to speak with someone else in office, in which Brittney spoke with him and told him we would try to get him scheduled at [facility]. I then tried to get a hold of patient to schedule a visit with [facility] Kim,DO virtually but wife stated that he was in a meeting and couldn't talk.

## 2019-09-07 NOTE — Telephone Encounter (Signed)
Patient following up on prescription refill request  for  eszopiclone (LUNESTA) 2 MG TABS tablet. Patient states that he is completely out and would need this refilled by the end of the day. Pt is TOC patient and is scheduled for 10/19/19 at 9:40am

## 2019-09-07 NOTE — Telephone Encounter (Signed)
Pls see message below and advise:  Last OV: 08/27/19 (for redness in eye) Next OV: 10/19/19 PMP website checked: 08/04/19

## 2019-09-14 DIAGNOSIS — G4733 Obstructive sleep apnea (adult) (pediatric): Secondary | ICD-10-CM | POA: Diagnosis not present

## 2019-09-28 ENCOUNTER — Other Ambulatory Visit: Payer: Self-pay | Admitting: Physician Assistant

## 2019-09-28 ENCOUNTER — Telehealth: Payer: Self-pay | Admitting: Physician Assistant

## 2019-09-28 ENCOUNTER — Other Ambulatory Visit: Payer: Self-pay

## 2019-09-28 ENCOUNTER — Encounter: Payer: Self-pay | Admitting: Internal Medicine

## 2019-09-28 ENCOUNTER — Ambulatory Visit (INDEPENDENT_AMBULATORY_CARE_PROVIDER_SITE_OTHER): Payer: BC Managed Care – PPO | Admitting: Physician Assistant

## 2019-09-28 ENCOUNTER — Ambulatory Visit (HOSPITAL_COMMUNITY)
Admission: RE | Admit: 2019-09-28 | Discharge: 2019-09-28 | Disposition: A | Discharge status: Home / Self Care | Payer: BC Managed Care – PPO | Source: Ambulatory Visit | Attending: Physician Assistant | Admitting: Physician Assistant

## 2019-09-28 ENCOUNTER — Telehealth: Payer: Self-pay

## 2019-09-28 ENCOUNTER — Encounter (HOSPITAL_COMMUNITY): Payer: Self-pay

## 2019-09-28 ENCOUNTER — Encounter: Payer: Self-pay | Admitting: Physician Assistant

## 2019-09-28 VITALS — BP 108/66 | HR 68 | Temp 97.2°F | Ht 67.0 in | Wt 275.4 lb

## 2019-09-28 DIAGNOSIS — R1030 Lower abdominal pain, unspecified: Secondary | ICD-10-CM

## 2019-09-28 DIAGNOSIS — K5792 Diverticulitis of intestine, part unspecified, without perforation or abscess without bleeding: Secondary | ICD-10-CM

## 2019-09-28 DIAGNOSIS — R109 Unspecified abdominal pain: Secondary | ICD-10-CM | POA: Diagnosis not present

## 2019-09-28 LAB — COMPREHENSIVE METABOLIC PANEL
ALT: 18 U/L (ref 0–53)
AST: 19 U/L (ref 0–37)
Albumin: 4 g/dL (ref 3.5–5.2)
Alkaline Phosphatase: 71 U/L (ref 39–117)
BUN: 19 mg/dL (ref 6–23)
CO2: 26 mEq/L (ref 19–32)
Calcium: 9 mg/dL (ref 8.4–10.5)
Chloride: 105 mEq/L (ref 96–112)
Creatinine, Ser: 1.1 mg/dL (ref 0.40–1.50)
GFR: 67.3 mL/min (ref >60.00)
Glucose, Bld: 108 mg/dL — ABNORMAL HIGH (ref 70–99)
Potassium: 4.3 mEq/L (ref 3.5–5.1)
Sodium: 139 mEq/L (ref 135–145)
Total Bilirubin: 0.9 mg/dL (ref 0.2–1.2)
Total Protein: 6.8 g/dL (ref 6.0–8.3)

## 2019-09-28 LAB — POCT URINALYSIS DIPSTICK
Bilirubin, UA: NEGATIVE
Blood, UA: NEGATIVE
Glucose, UA: NEGATIVE
Ketones, UA: NEGATIVE
Leukocytes, UA: NEGATIVE
Nitrite, UA: NEGATIVE
Protein, UA: NEGATIVE
Spec Grav, UA: 1.025 (ref 1.010–1.025)
Urobilinogen, UA: 0.2 E.U./dL
pH, UA: 6 (ref 5.0–8.0)

## 2019-09-28 LAB — CBC WITH DIFFERENTIAL/PLATELET
Basophils Absolute: 0 10*3/uL (ref 0.0–0.1)
Basophils Relative: 0.4 % (ref 0.0–3.0)
Eosinophils Absolute: 0.1 10*3/uL (ref 0.0–0.7)
Eosinophils Relative: 1 % (ref 0.0–5.0)
HCT: 46.7 % (ref 39.0–52.0)
Hemoglobin: 15.7 g/dL (ref 13.0–17.0)
Lymphocytes Relative: 22.1 % (ref 12.0–46.0)
Lymphs Abs: 2.3 10*3/uL (ref 0.7–4.0)
MCHC: 33.5 g/dL (ref 30.0–36.0)
MCV: 97.9 fl (ref 78.0–100.0)
Monocytes Absolute: 1 10*3/uL (ref 0.1–1.0)
Monocytes Relative: 9.7 % (ref 3.0–12.0)
Neutro Abs: 7 10*3/uL (ref 1.4–7.7)
Neutrophils Relative %: 66.8 % (ref 43.0–77.0)
Platelets: 219 10*3/uL (ref 150.0–400.0)
RBC: 4.77 Mil/uL (ref 4.22–5.81)
RDW: 12.8 % (ref 11.5–15.5)
WBC: 10.6 10*3/uL — ABNORMAL HIGH (ref 4.0–10.5)

## 2019-09-28 LAB — POCT I-STAT, CHEM 8
BUN: 19 mg/dL (ref 8–23)
Calcium, Ion: 1.19 mmol/L (ref 1.15–1.40)
Chloride: 103 mmol/L (ref 98–111)
Creatinine, Ser: 1.2 mg/dL (ref 0.61–1.24)
Glucose, Bld: 102 mg/dL — ABNORMAL HIGH (ref 70–99)
HCT: 47 % (ref 39.0–52.0)
Hemoglobin: 16 g/dL (ref 13.0–17.0)
Potassium: 4.5 mmol/L (ref 3.5–5.1)
Sodium: 139 mmol/L (ref 135–145)
TCO2: 26 mmol/L (ref 22–32)

## 2019-09-28 LAB — LIPASE: Lipase: 33 U/L (ref 11.0–59.0)

## 2019-09-28 MED ORDER — IOHEXOL 9 MG/ML PO SOLN
ORAL | Status: AC
  Filled 2019-09-28: qty 1000

## 2019-09-28 MED ORDER — METRONIDAZOLE 500 MG PO TABS: 500.0000 mg | ORAL_TABLET | Freq: Three times a day (TID) | ORAL | 0 refills | Status: DC

## 2019-09-28 MED ORDER — IOHEXOL 300 MG/ML  SOLN
100.0000 mL | Freq: Once | INTRAMUSCULAR | Status: AC | PRN
  Administered 2019-09-28: 100 mL via INTRAVENOUS

## 2019-09-28 MED ORDER — IOHEXOL 9 MG/ML PO SOLN
1000.0000 mL | ORAL | Status: AC
  Administered 2019-09-28: 1000 mL via ORAL

## 2019-09-28 MED ORDER — CIPROFLOXACIN HCL 500 MG PO TABS: 500.0000 mg | ORAL_TABLET | Freq: Two times a day (BID) | ORAL | 0 refills | Status: AC

## 2019-09-28 MED ORDER — SODIUM CHLORIDE (PF) 0.9 % IJ SOLN
INTRAMUSCULAR | Status: AC
  Filled 2019-09-28: qty 50

## 2019-09-28 NOTE — Progress Notes (Signed)
Louis Watson is a 64 y.o. male here for a new problem.  I acted as a Education administrator for [employer], PA-C Louis Pickler, LPN  History of Present Illness:   Chief Complaint  Patient presents with  . Abdominal Pain    HPI   Abdominal pain Significant history of pancreatitis, approx 15 years ago - no ETOH since, and diverticulosis, no hx of diverticulitis. History of constipation, taking stool softener daily with some relief.  No prior hx of nephrolithiasis or UTI.   Reports onset of lower abdomen pain about an inch below umbilicus, started on Wednesday, worsened yesterday. Also endorses recent increase in constipation, pressure in abdomen when needing to urinate, chills and some lower back pain. Pain is a 2/10.  Denies dysuria, rectal bleeding, hematuria, recent suspicious food intake, recent ETOH intake, pain in abdomen with walking, fever, n/v/d.  Took Ibuprofen with some relief.  Past Medical History:  Diagnosis Date  . Diverticulosis 01/17/2017  . Hx of lumbar discectomy 01/17/2017  . Hypertension   . OSA on CPAP    setting = 8  . Pancreatitis 2013  . Plantar fasciitis      Social History   Socioeconomic History  . Marital status: Married    Spouse name: Not on file  . Number of children: Not on file  . Years of education: Not on file  . Highest education level: Not on file  Occupational History    Employer: [employer]  Tobacco Use  . Smoking status: Current Every Day Smoker    Types: Cigars  . Smokeless tobacco: Never Used  Substance and Sexual Activity  . Alcohol use: No  . Drug use: No  . Sexual activity: Not on file  Other Topics Concern  . Not on file  Social History Narrative  . Not on file   Social Determinants of Health   Financial Resource Strain:   . Difficulty of Paying Living Expenses: Not on file  Food Insecurity:   . Worried About Charity fundraiser in the Last Year: Not on file  . Ran Out of Food in the Last Year: Not on file   Transportation Needs:   . Lack of Transportation (Medical): Not on file  . Lack of Transportation (Non-Medical): Not on file  Physical Activity:   . Days of Exercise per Week: Not on file  . Minutes of Exercise per Session: Not on file  Stress:   . Feeling of Stress : Not on file  Social Connections:   . Frequency of Communication with Friends and Family: Not on file  . Frequency of Social Gatherings with Friends and Family: Not on file  . Attends Religious Services: Not on file  . Active Member of Clubs or Organizations: Not on file  . Attends Archivist Meetings: Not on file  . Marital Status: Not on file  Intimate Partner Violence:   . Fear of Current or Ex-Partner: Not on file  . Emotionally Abused: Not on file  . Physically Abused: Not on file  . Sexually Abused: Not on file    Past Surgical History:  Procedure Laterality Date  . KNEE ARTHROSCOPY  2013  . LUMBAR DISC SURGERY  1993    Family History  Problem Relation Age of Onset  . Congestive Heart Failure Mother     Allergies  Allergen Reactions  . Nystatin Cough  . Penicillins Rash    Current Medications:   Current Outpatient Medications:  .  amLODipine (NORVASC) 5 MG  tablet, TAKE 1 TABLET BY MOUTH EVERY DAY, Disp: 90 tablet, Rfl: 3 .  aspirin EC 81 MG tablet, Take 81 mg by mouth daily., Disp: , Rfl:  .  augmented betamethasone dipropionate (DIPROLENE-AF) 0.05 % ointment, APPLY 1 APPLICATION TOPICALLY DAILY AS NEEDED (RASH)., Disp: 15 g, Rfl: 3 .  eszopiclone (LUNESTA) 2 MG TABS tablet, Take 1 tablet (2 mg total) by mouth at bedtime as needed for sleep., Disp: 30 tablet, Rfl: 1 .  fluticasone (CUTIVATE) 0.05 % cream, APPLY TO AFFECTED AREA FACE/UNDERARMS TWICE A DAY AS NEEDED, Disp: , Rfl:  .  ibuprofen (ADVIL) 600 MG tablet, Take 600 mg by mouth 3 (three) times daily., Disp: , Rfl:  .  ketoconazole (NIZORAL) 2 % cream, APPLY TO AFFECTED AREA EVERY DAY FOR 14 DAYS, Disp: , Rfl:  .  prednisoLONE  acetate (PRED FORTE) 1 % ophthalmic suspension, 1 drop 4 (four) times daily., Disp: , Rfl:  .  sildenafil (REVATIO) 20 MG tablet, Take 1 tablet (20 mg total) by mouth as needed. 4 hours prior to sexual activity, Disp: 30 tablet, Rfl: 0   Review of Systems:   ROS  Negative unless otherwise specified per HPI.  Vitals:   Vitals:   09/28/19 0943  BP: 108/66  Pulse: 68  Temp: (!) 97.2 F (36.2 C)  TempSrc: Temporal  SpO2: 96%  Weight: 275 lb 6.1 oz (124.9 kg)  Height: 5\' 7"  (1.702 m)     Body mass index is 43.13 kg/m.  Physical Exam:   Physical Exam Vitals and nursing note reviewed.  Constitutional:      General: He is not in acute distress.    Appearance: He is well-developed. He is not ill-appearing or toxic-appearing.  Cardiovascular:     Rate and Rhythm: Normal rate and regular rhythm.     Pulses: Normal pulses.     Heart sounds: Normal heart sounds, S1 normal and S2 normal.     Comments: No LE edema Pulmonary:     Effort: Pulmonary effort is normal.     Breath sounds: Normal breath sounds.  Abdominal:     General: Abdomen is flat. Bowel sounds are normal.     Palpations: Abdomen is soft.     Tenderness: There is abdominal tenderness in the periumbilical area. There is guarding (slight guarding at below umbilicus). There is no right CVA tenderness or left CVA tenderness. Negative signs include Murphy's sign, Rovsing's sign and McBurney's sign.  Skin:    General: Skin is warm and dry.  Neurological:     Mental Status: He is alert.     GCS: GCS eye subscore is 4. GCS verbal subscore is 5. GCS motor subscore is 6.  Psychiatric:        Speech: Speech normal.        Behavior: Behavior normal. Behavior is cooperative.     Results for orders placed or performed in visit on 09/28/19  POCT urinalysis dipstick  Result Value Ref Range   Color, UA golden    Clarity, UA clear    Glucose, UA Negative Negative   Bilirubin, UA Negative    Ketones, UA Negative    Spec  Grav, UA 1.025 1.010 - 1.025   Blood, UA Negative    pH, UA 6.0 5.0 - 8.0   Protein, UA Negative Negative   Urobilinogen, UA 0.2 0.2 or 1.0 E.U./dL   Nitrite, UA Negative    Leukocytes, UA Negative Negative   Appearance     Odor  Assessment and Plan:   Louis Watson was seen today for abdominal pain.  Diagnoses and all orders for this visit:  Lower abdominal pain -     POCT urinalysis dipstick -     Urine Culture -     CT Abdomen Pelvis W Contrast; Future -     CBC with Differential/Platelet -     Comprehensive metabolic panel -     Lipase   Patient does not appear to be in acute distress at this time, however he is point-tender to the area just below umbilicus. Will obtain CT scan of abd/pelvis for further work-up. Labs pending. Question diverticulitis vs colitis vs early appendicitis vs other. Worsening precautions advised in the interim.  . Reviewed expectations re: course of current medical issues. . Discussed self-management of symptoms. . Outlined signs and symptoms indicating need for more acute intervention. . Patient verbalized understanding and all questions were answered. . See orders for this visit as documented in the electronic medical record. . Patient received an After-Visit Summary.  CMA or LPN served as scribe during this visit. History, Physical, and Plan performed by medical provider. The above documentation has been reviewed and is accurate and complete.  Jarold Motto, PA-C

## 2019-09-28 NOTE — Patient Instructions (Signed)
It was great to see you!  We will be in touch with your lab results and CT scan results.   Abdominal pain Many things can cause belly (abdominal) pain. Most times, belly pain is not dangerous. Many cases of belly pain can be watched and treated at home. Sometimes belly pain is serious, though. Your doctor will try to find the cause of your belly pain.   Follow these instructions at home:  Take over-the-counter and prescription medicines only as told by your doctor. Do not take medicines that help you poop (laxatives) unless told to by your doctor.  Drink enough fluid to keep your pee (urine) clear or pale yellow.  Watch your belly pain for any changes.  Keep all follow-up visits as told by your doctor. This is important.  Contact a doctor if:  Your belly pain changes or gets worse.  You are not hungry, or you lose weight without trying.  You are having trouble pooping (constipated) or have watery poop (diarrhea) for more than 2-3 days.  You have pain when you pee or poop.  Your belly pain wakes you up at night.  Your pain gets worse with meals, after eating, or with certain foods.  You are throwing up and cannot keep anything down.  You have a fever.  Get help right away if:  Your pain does not go away as soon as your doctor says it should.  You cannot stop throwing up.  Your pain is only in areas of your belly, such as the right side or the left lower part of the belly.  You have bloody or black poop, or poop that looks like tar.  You have very bad pain, cramping, or bloating in your belly.  You have signs of not having enough fluid or water in your body (dehydration), such as: ? Dark pee, very little pee, or no pee. ? Cracked lips. ? Dry mouth. ? Sunken eyes. ? Sleepiness. ? Weakness.

## 2019-09-28 NOTE — Telephone Encounter (Signed)
Error

## 2019-09-28 NOTE — Telephone Encounter (Signed)
error 

## 2019-09-29 LAB — URINE CULTURE
MICRO NUMBER:: 10121769
Result:: NO GROWTH
SPECIMEN QUALITY:: ADEQUATE

## 2019-10-16 DIAGNOSIS — G4733 Obstructive sleep apnea (adult) (pediatric): Secondary | ICD-10-CM | POA: Diagnosis not present

## 2019-10-19 ENCOUNTER — Encounter: Payer: Self-pay | Admitting: Family Medicine

## 2019-10-19 ENCOUNTER — Ambulatory Visit (INDEPENDENT_AMBULATORY_CARE_PROVIDER_SITE_OTHER): Payer: BC Managed Care – PPO | Admitting: Family Medicine

## 2019-10-19 ENCOUNTER — Other Ambulatory Visit: Payer: Self-pay

## 2019-10-19 VITALS — BP 104/64 | HR 65 | Temp 97.2°F | Ht 70.0 in | Wt 278.0 lb

## 2019-10-19 DIAGNOSIS — R7303 Prediabetes: Secondary | ICD-10-CM | POA: Diagnosis not present

## 2019-10-19 DIAGNOSIS — G4700 Insomnia, unspecified: Secondary | ICD-10-CM | POA: Diagnosis not present

## 2019-10-19 DIAGNOSIS — N529 Male erectile dysfunction, unspecified: Secondary | ICD-10-CM

## 2019-10-19 DIAGNOSIS — H18519 Endothelial corneal dystrophy, unspecified eye: Secondary | ICD-10-CM | POA: Insufficient documentation

## 2019-10-19 DIAGNOSIS — I10 Essential (primary) hypertension: Secondary | ICD-10-CM

## 2019-10-19 MED ORDER — ESZOPICLONE 2 MG PO TABS: 2.0000 mg | ORAL_TABLET | Freq: Every evening | ORAL | 0 refills | Status: DC | PRN

## 2019-10-19 MED ORDER — SILDENAFIL CITRATE 20 MG PO TABS: 20.0000 mg | ORAL_TABLET | ORAL | 1 refills | Status: DC | PRN

## 2019-10-19 NOTE — Progress Notes (Signed)
Patient: Louis Watson MRN: [id number] DOB: 13-Jul-1956 PCP: Orland Mustard, MD     Subjective:  Chief Complaint  Patient presents with  . Transitions Of Care    HPI: The patient is a 64 y.o. male who presents today for transfer of care. He has past medical history of HTN, prediabetes, OSA on cpap, insomnia, ED. He is doing well. Recently had bout of diverticulitis.   Hypertension: Here for follow up of hypertension.  Currently on norvasc 5mg  daily .  Takes medication as prescribed and denies any side effects. Exercise includes none. Weight has been stable. Denies any chest pain, headaches, shortness of breath, vision changes, swelling in lower extremities. Cbc and cmp just recently done. No refills needed.   Prediabetes: last a1c in 2019 was 5.9. tries to follow a low carb diet. Does not exercise. No family hx of diabetes. Will come back for physical for labs.   Insomnia: has been on lunesta for 6 years. Like this and does well. Has been on ambien and did not like this. Needing refill of lunesta.   ED: currently on sildenafil. Does well. Needs a refill  Review of Systems  Constitutional: Negative for chills, fatigue and fever.  HENT: Negative for sinus pressure, sinus pain and sore throat.   Respiratory: Negative for cough, shortness of breath and wheezing.   Cardiovascular: Negative for chest pain, palpitations and leg swelling.  Gastrointestinal: Negative for abdominal pain, nausea and vomiting.  Musculoskeletal: Negative for back pain and neck pain.  Neurological: Negative for dizziness, light-headedness and headaches.    Allergies Patient is allergic to nystatin and penicillins.  Past Medical History Patient  has a past medical history of Diverticulosis (01/17/2017), lumbar discectomy (01/17/2017), Hypertension, OSA on CPAP, Pancreatitis (2013), and Plantar fasciitis.  Surgical History Patient  has a past surgical history that includes Lumbar disc surgery (1993) and Knee  arthroscopy (2013).  Family History Pateint's family history includes Congestive Heart Failure in his mother.  Social History Patient  reports that he has been smoking cigars. He has never used smokeless tobacco. He reports that he does not drink alcohol or use drugs.    Objective: Vitals:   10/19/19 0938  BP: 104/64  Pulse: 65  Temp: (!) 97.2 F (36.2 C)  TempSrc: Temporal  SpO2: 94%  Weight: 278 lb (126.1 kg)  Height: 5\' 10"  (1.778 m)    Body mass index is 39.89 kg/m.  Physical Exam Vitals reviewed.  Constitutional:      Appearance: Normal appearance. He is well-developed. He is obese.  HENT:     Head: Normocephalic and atraumatic.     Right Ear: Tympanic membrane, ear canal and external ear normal.     Left Ear: Tympanic membrane, ear canal and external ear normal.     Nose: Nose normal.     Mouth/Throat:     Mouth: Mucous membranes are moist.  Eyes:     Extraocular Movements: Extraocular movements intact.     Conjunctiva/sclera: Conjunctivae normal.     Pupils: Pupils are equal, round, and reactive to light.  Neck:     Thyroid: No thyromegaly.  Cardiovascular:     Rate and Rhythm: Normal rate and regular rhythm.     Heart sounds: Normal heart sounds. No murmur.  Pulmonary:     Effort: Pulmonary effort is normal.     Breath sounds: Normal breath sounds.  Abdominal:     General: Bowel sounds are normal. There is no distension.     Palpations: Abdomen  is soft.     Tenderness: There is no abdominal tenderness.  Musculoskeletal:     Cervical back: Normal range of motion and neck supple.  Lymphadenopathy:     Cervical: No cervical adenopathy.  Skin:    General: Skin is warm and dry.     Findings: No rash.  Neurological:     General: No focal deficit present.     Mental Status: He is alert and oriented to person, place, and time.     Cranial Nerves: No cranial nerve deficit.     Coordination: Coordination normal.     Deep Tendon Reflexes: Reflexes normal.   Psychiatric:        Mood and Affect: Mood normal.        Behavior: Behavior normal.      Office Visit from 10/19/2019 in [place]  PHQ-2 Total Score  0         Assessment/plan: 1. Essential hypertension Blood pressure is to goal. Continue current anti-hypertensive medications. Refills not given and routine lab work will be done at annual. Recommended routine exercise and healthy diet including DASH diet and mediterranean diet. Encouraged weight loss. He is actually running low, but asymptomatic. Will see what his BP is at follow up.   2. Insomnia, unspecified type pmp reviewed. Fills correctly. Will need to get drug contract done at next visit. Chart reviewed after he left and no drug contract in his chart.  - eszopiclone (LUNESTA) 2 MG TABS tablet; Take 1 tablet (2 mg total) by mouth at bedtime as needed for sleep.  Dispense: 90 tablet; Refill: 0  3. Erectile dysfunction, unspecified erectile dysfunction type  - sildenafil (REVATIO) 20 MG tablet; Take 1 tablet (20 mg total) by mouth as needed. 4 hours prior to sexual activity  Dispense: 30 tablet; Refill: 1   This visit occurred during the SARS-CoV-2 public health emergency.  Safety protocols were in place, including screening questions prior to the visit, additional usage of staff PPE, and extensive cleaning of exam room while observing appropriate contact time as indicated for disinfecting solutions.   4. prediabtes Due for labs, will check at his annual. Encouraged exercise and weight loss.   Chart reviewed in full detail with patient. HM reviewed, medication, problem list, allergies. Addressed medical problems and concerns.   This visit occurred during the SARS-CoV-2 public health emergency.  Safety protocols were in place, including screening questions prior to the visit, additional usage of staff PPE, and extensive cleaning of exam room while observing appropriate contact time as indicated for  disinfecting solutions.    Return in about 3 months (around 01/16/2020) for annual with fasting labs. Orma Flaming, MD [place]   10/19/2019

## 2019-10-24 ENCOUNTER — Encounter: Payer: Self-pay | Admitting: Internal Medicine

## 2019-10-24 ENCOUNTER — Ambulatory Visit (INDEPENDENT_AMBULATORY_CARE_PROVIDER_SITE_OTHER): Payer: BC Managed Care – PPO | Admitting: Internal Medicine

## 2019-10-24 VITALS — BP 112/80 | HR 76 | Temp 97.5°F | Ht 70.0 in | Wt 279.0 lb

## 2019-10-24 DIAGNOSIS — R935 Abnormal findings on diagnostic imaging of other abdominal regions, including retroperitoneum: Secondary | ICD-10-CM | POA: Diagnosis not present

## 2019-10-24 DIAGNOSIS — Z01818 Encounter for other preprocedural examination: Secondary | ICD-10-CM | POA: Diagnosis not present

## 2019-10-24 DIAGNOSIS — K5732 Diverticulitis of large intestine without perforation or abscess without bleeding: Secondary | ICD-10-CM | POA: Diagnosis not present

## 2019-10-24 DIAGNOSIS — K5900 Constipation, unspecified: Secondary | ICD-10-CM | POA: Diagnosis not present

## 2019-10-24 DIAGNOSIS — R194 Change in bowel habit: Secondary | ICD-10-CM

## 2019-10-24 MED ORDER — NA SULFATE-K SULFATE-MG SULF 17.5-3.13-1.6 GM/177ML PO SOLN: 1.0000 | Freq: Once | ORAL | 0 refills | Status: AC

## 2019-10-24 NOTE — Patient Instructions (Signed)
You have been scheduled for a colonoscopy. Please follow written instructions given to you at your visit today.  Please pick up your prep supplies at the pharmacy within the next 1-3 days. If you use inhalers (even only as needed), please bring them with you on the day of your procedure.   

## 2019-10-24 NOTE — Progress Notes (Signed)
HISTORY OF PRESENT ILLNESS:  Louis Watson is a 64 y.o. 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  Patient is an excellent historian.  He tells me that he is undergone colonoscopy on 3 prior occasions.  Twice in [place] without abnormalities.  Once in [place] approximately 4 years ago with diverticulosis and hemorrhoids.  He tells me that he was in his usual state of health September 2019 when he presented to the local hospital here with sepsis type picture after a trip to [place].  Was treated for cellulitis.  No problems until 3 months ago when he developed new onset constipation.  Straining with bowel movements.  In early February he developed nausea and lower abdominal pain.  No fevers.  He was felt to possibly have diverticulitis and underwent CT scan of the abdomen and pelvis which was performed September 28, 2019.  He was found to have sigmoid diverticulosis with fat stranding and wall thickening in the mid sigmoid colon.  Findings were felt to be consistent with acute diverticulitis.  He was treated with a 10-day course of ciprofloxacin and metronidazole.  After several days he felt better.  He currently describes some less intense more short-lived abdominal discomfort and ongoing irregularities of his bowels.  He denies new onset weight loss or bleeding.  There is no family history of colon cancer.  Previous CT scan as mentioned reviewed.  Blood work from September 28, 2019 revealed white blood cell count of 10.6.  Otherwise unremarkable CBC and comprehensive metabolic panel  REVIEW OF SYSTEMS:  All non-GI ROS negative unless otherwise stated in the HPI except for skin rash and back pain  Past Medical History:  Diagnosis Date  . Diverticulosis 01/17/2017  . Hx of lumbar discectomy 01/17/2017  . Hypertension   . OSA on CPAP     setting = 8  . Pancreatitis 2013  . Plantar fasciitis     Past Surgical History:  Procedure Laterality Date  . KNEE ARTHROSCOPY  2013  . [place] SURGERY  1993    Social History Ananda Sitzer  reports that he has been smoking cigars. He has never used smokeless tobacco. He reports that he does not drink alcohol or use drugs.  family history includes Congestive Heart Failure in his mother.  Allergies  Allergen Reactions  . Nystatin Cough  . Penicillins Rash       PHYSICAL EXAMINATION: Vital signs: BP 112/80   Pulse 76   Temp (!) 97.5 F (36.4 C)   Ht 5\' 10"  (1.778 m)   Wt 279 lb (126.6 kg)   BMI 40.03 kg/m   Constitutional: generally well-appearing, no acute distress Psychiatric: alert and oriented x3, cooperative Eyes: extraocular movements intact, anicteric, conjunctiva pink Mouth: oral pharynx moist, no lesions Neck: supple no lymphadenopathy Cardiovascular: heart regular rate and rhythm, no murmur Lungs: clear to auscultation bilaterally Abdomen: soft, obese, nontender, nondistended, no obvious ascites, no peritoneal signs, normal bowel sounds, no organomegaly Rectal: Deferred until colonoscopy Extremities: no clubbing, cyanosis, or lower extremity edema bilaterally Skin: no lesions on visible extremities Neuro: No focal deficits.  Cranial nerves intact  ASSESSMENT:  1.  Change in bowel habits as manifested by constipation and straining 2.  Recent bout of lower abdominal pain most likely diverticulitis 3.  Abnormal CT scan with thickening of the sigmoid colon region and surrounding inflammatory change.  Likely  diverticulitis.  Rule out neoplasm, particularly given change in bowel habits   PLAN:  1.  Discussion on diverticular disease and diverticulitis.  Supplemental information provided 2.  Recommend fiber supplementation such as Citrucel or Metamucil to improve overall bowel consistency 3.  Schedule colonoscopy to evaluate the sigmoid colon.  Rule  out entities other than diverticulitis mimicking diverticulitis.  Most importantly rule out neoplasm.The nature of the procedure, as well as the risks, benefits, and alternatives were carefully and thoroughly reviewed with the patient. Ample time for discussion and questions allowed. The patient understood, was satisfied, and agreed to proceed.

## 2019-12-04 ENCOUNTER — Encounter: Payer: BC Managed Care – PPO | Admitting: Internal Medicine

## 2019-12-05 IMAGING — MR MR MRA HEAD W/O CM
1 series · 11 of 48 positions shown · non-contrast
Comparison: None.

CLINICAL DATA: Six month history of dizziness. History of
hypertension.

EXAM:
MRI HEAD WITHOUT CONTRAST
MRA HEAD WITHOUT CONTRAST
TECHNIQUE: Multiplanar, multiecho pulse sequences of the brain and surrounding
structures were obtained without intravenous contrast. Angiographic
images of the head were obtained using MRA technique without
contrast.

[Series 8: [id number] · axial · 0.6mm · 0.26mm/px · z∈[-69,+10]mm · 11 of 149 slices shown]
[im 10/149]
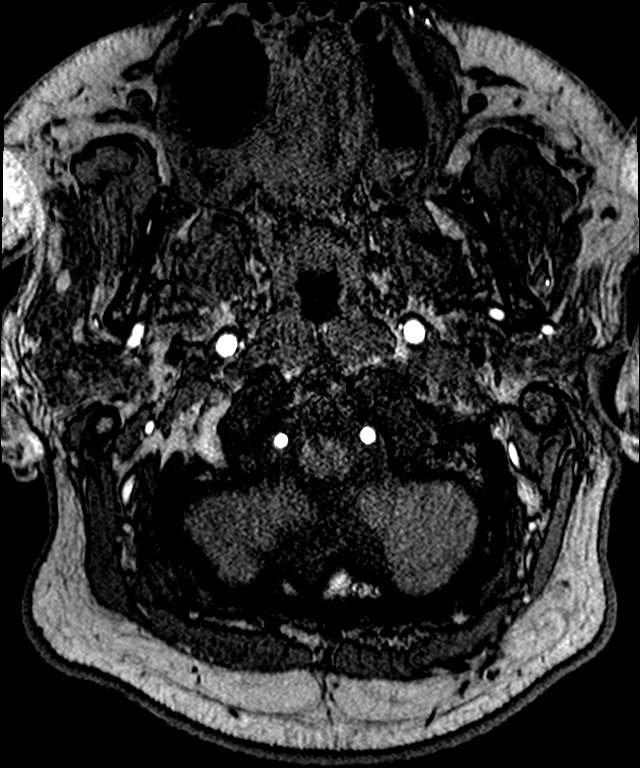
[im 26/149]
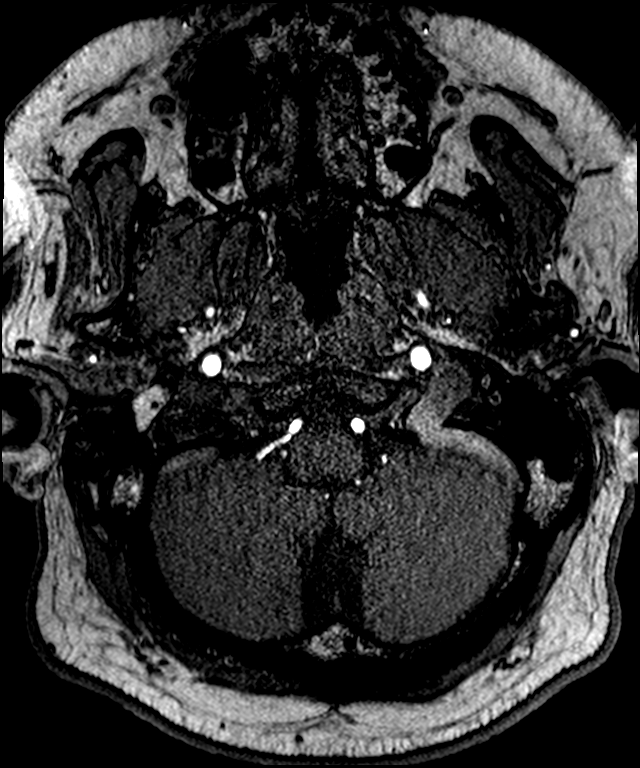
[im 29/149]
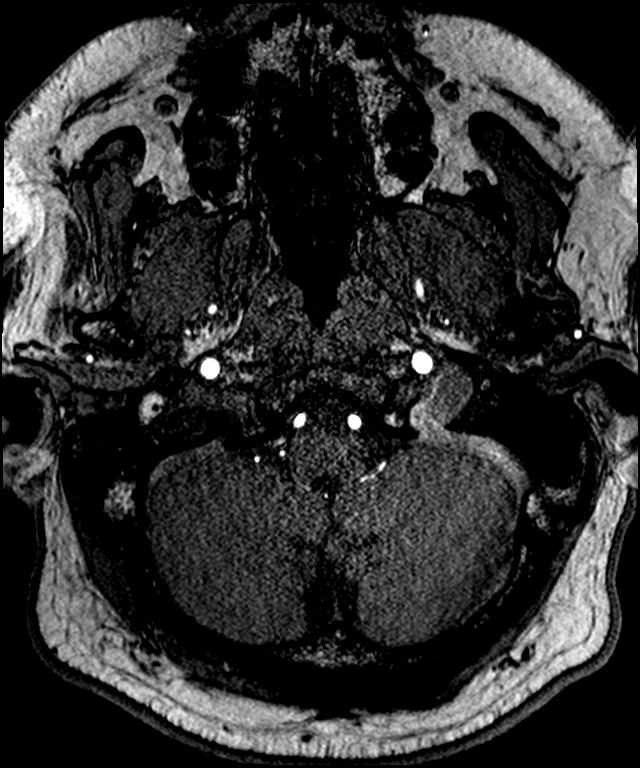
[im 48/149]
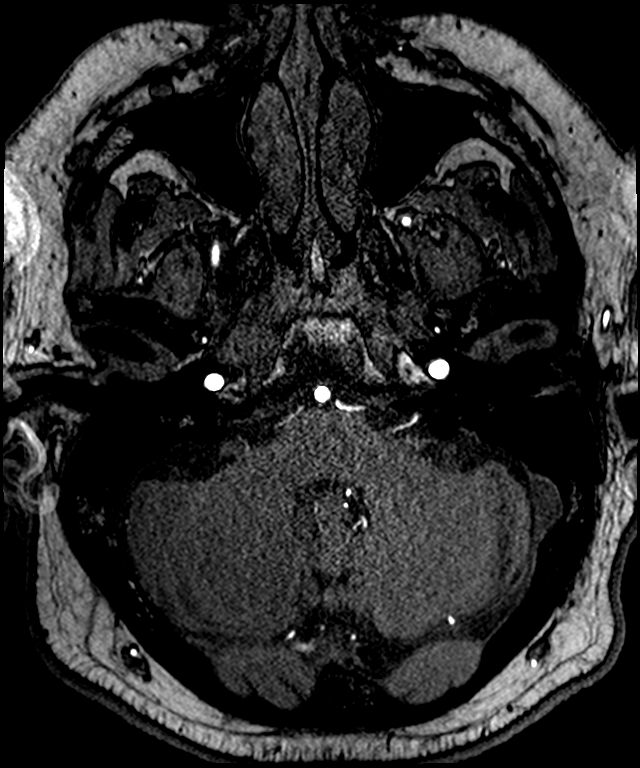
[im 67/149]
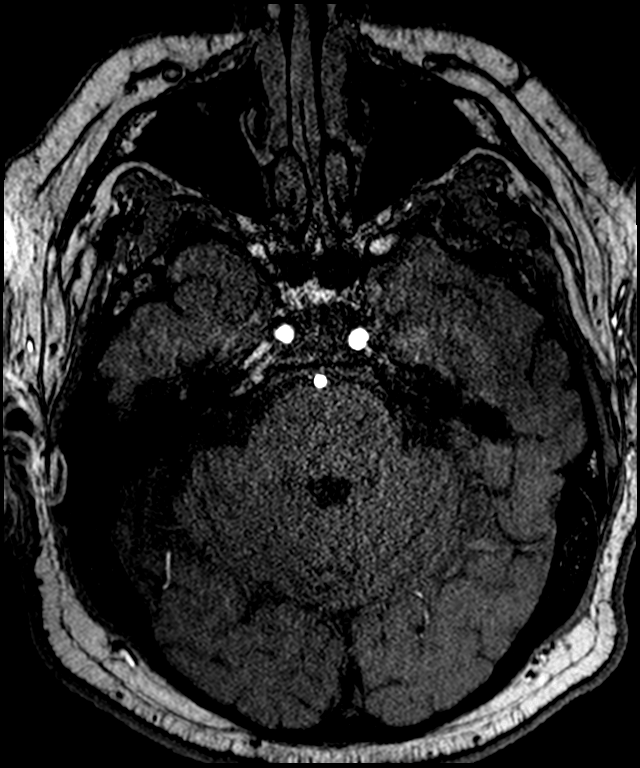
[im 76/149]
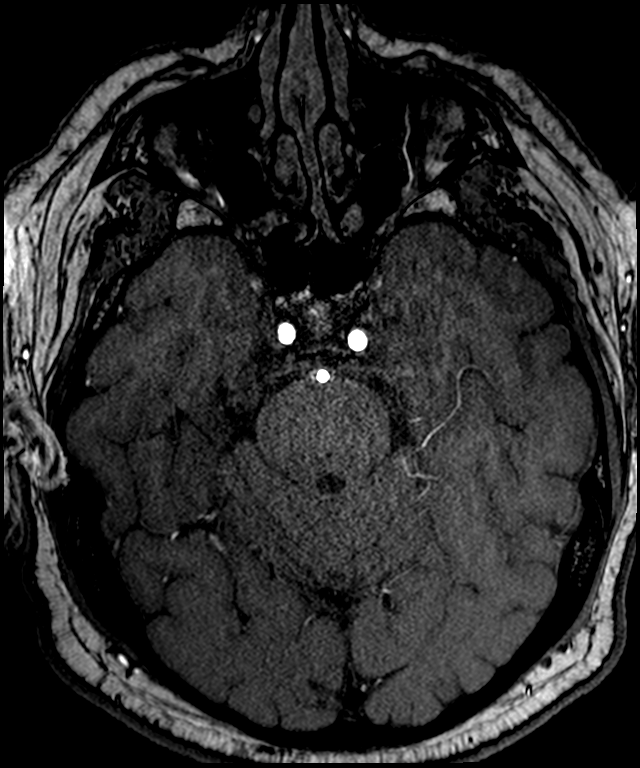
[im 86/149]
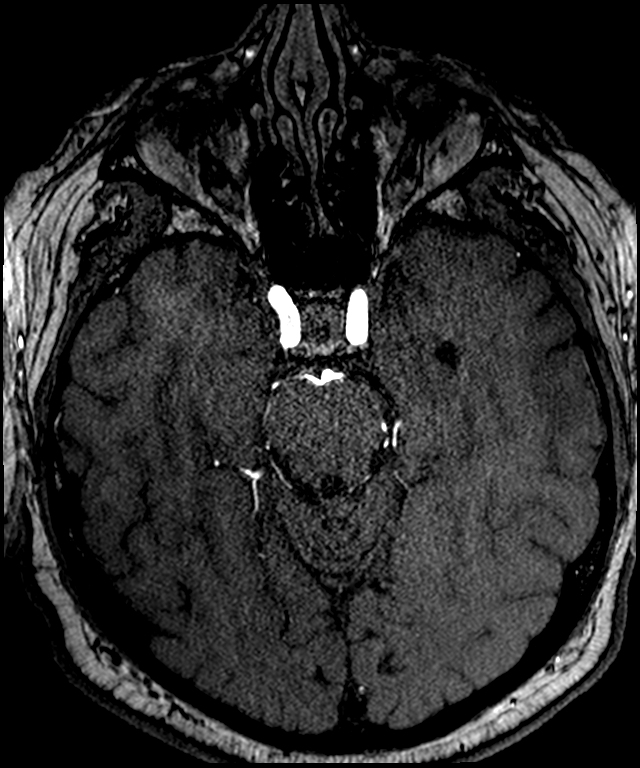
[im 104/149]
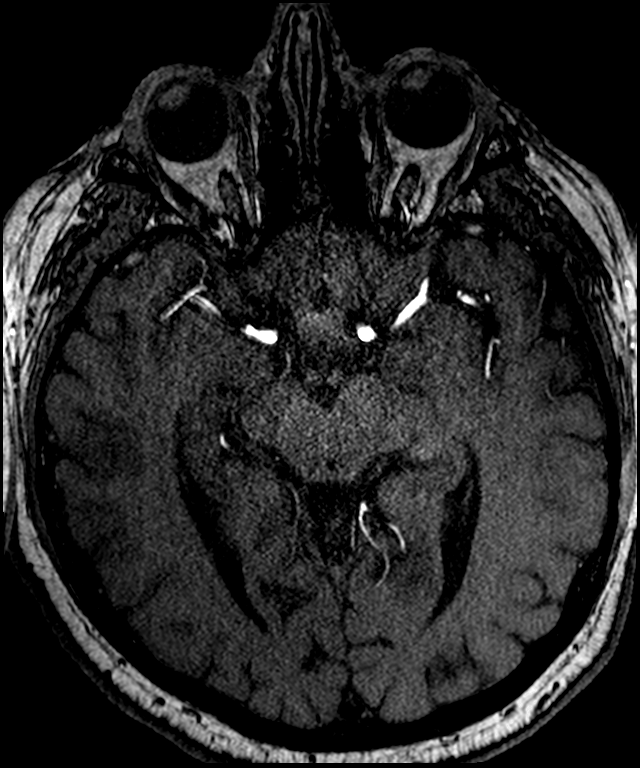
[im 123/149]
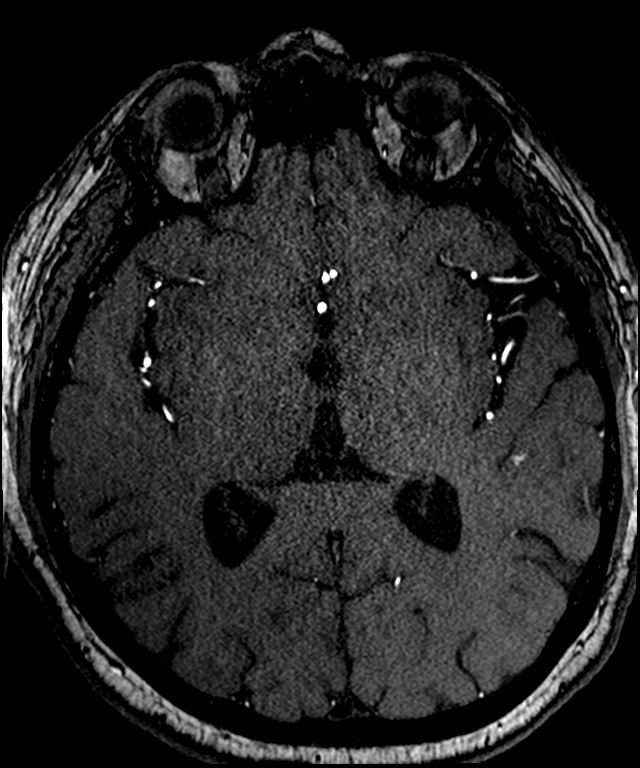
[im 126/149]
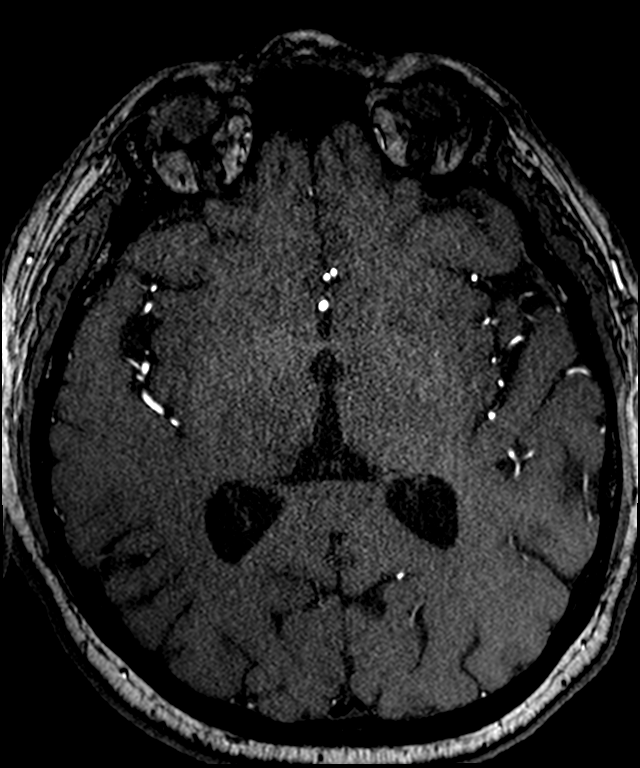
[im 142/149]
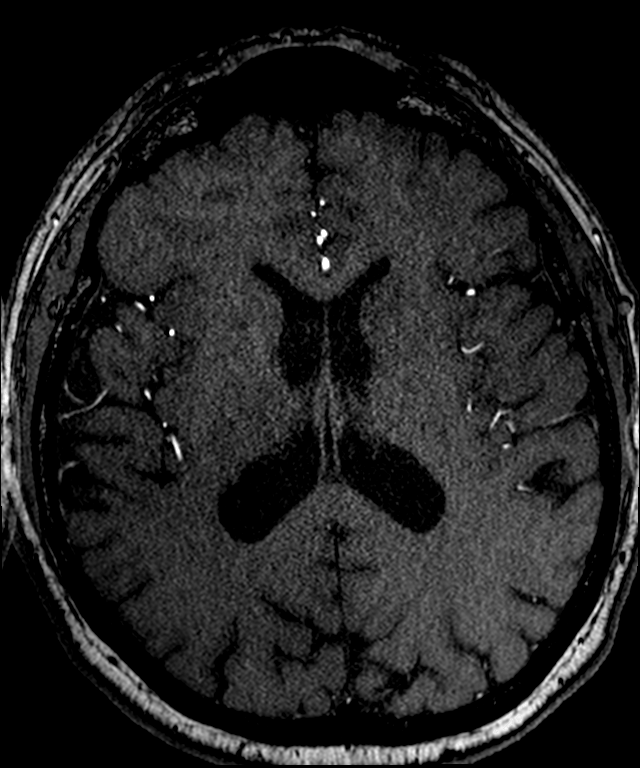

[11 of 48 positions shown; findings below may reference images not displayed]

FINDINGS: MRI HEAD FINDINGS

Brain: Diffusion imaging does not show any acute or subacute
infarction. The brainstem and cerebellum are normal. Cerebral
hemispheres are normal except for a few scattered punctate foci of
T2 and FLAIR signal within the white matter, likely the earliest
manifestation of small vessel change. No cortical or large vessel
territory infarction. No mass lesion, hemorrhage, hydrocephalus or
extra-axial collection. CP angle regions appear normal. After
contrast administration, no abnormal enhancement occurs.

Vascular: Major vessels at the base of the brain show flow.

Skull and upper cervical spine: Negative

Sinuses/Orbits: Clear/normal

Other: None

MRA HEAD FINDINGS

Both internal carotid arteries are widely patent into the brain. No
siphon stenosis. The anterior and middle cerebral vessels are patent
without proximal stenosis, aneurysm or vascular malformation.

Both vertebral arteries are widely patent to the basilar. No basilar
stenosis. Posterior circulation branch vessels appear normal.
IMPRESSION: No cause of dizziness is identified. No acute or reversible finding.
Normal MRA. Normal MRI with exception of a few scattered punctate
foci of T2 and FLAIR signal within the hemispheric white matter
which, in a patient with a history of hypertension, probably
reflects the earliest manifestation of small vessel change.

## 2019-12-09 ENCOUNTER — Other Ambulatory Visit: Payer: Self-pay | Admitting: Family Medicine

## 2019-12-09 DIAGNOSIS — N529 Male erectile dysfunction, unspecified: Secondary | ICD-10-CM

## 2020-01-04 ENCOUNTER — Other Ambulatory Visit: Payer: Self-pay | Admitting: Family Medicine

## 2020-01-04 DIAGNOSIS — N529 Male erectile dysfunction, unspecified: Secondary | ICD-10-CM

## 2020-01-08 ENCOUNTER — Telehealth: Payer: Self-pay

## 2020-01-08 NOTE — Telephone Encounter (Signed)
Please advise 

## 2020-01-08 NOTE — Telephone Encounter (Signed)
Patient would like to know could he do his labs for his physical a day or two before his  scheduled appt 01/10/20.

## 2020-01-09 NOTE — Telephone Encounter (Signed)
Please let him know I don't work Tuesday's and im sorry for just getting this message! Usually, yes, he can come in early for labs. This go round, we will just have to do tomorrow, but I can future order for the next appointment. Sorry I didn't get back to him! See him tomorrow,  Dr. Artis Flock

## 2020-01-09 NOTE — Telephone Encounter (Signed)
Spoke with the patient and he stated that he will start coming in early for labs if possible. No other questions or concerns at this time.

## 2020-01-10 ENCOUNTER — Encounter: Payer: Self-pay | Admitting: Family Medicine

## 2020-01-10 ENCOUNTER — Ambulatory Visit (INDEPENDENT_AMBULATORY_CARE_PROVIDER_SITE_OTHER): Payer: BC Managed Care – PPO | Admitting: Family Medicine

## 2020-01-10 ENCOUNTER — Other Ambulatory Visit: Payer: Self-pay

## 2020-01-10 VITALS — BP 110/72 | HR 61 | Temp 97.2°F | Ht 70.0 in | Wt 276.4 lb

## 2020-01-10 DIAGNOSIS — L821 Other seborrheic keratosis: Secondary | ICD-10-CM | POA: Diagnosis not present

## 2020-01-10 DIAGNOSIS — Z125 Encounter for screening for malignant neoplasm of prostate: Secondary | ICD-10-CM

## 2020-01-10 DIAGNOSIS — M546 Pain in thoracic spine: Secondary | ICD-10-CM

## 2020-01-10 DIAGNOSIS — R7303 Prediabetes: Secondary | ICD-10-CM | POA: Diagnosis not present

## 2020-01-10 DIAGNOSIS — Z0000 Encounter for general adult medical examination without abnormal findings: Secondary | ICD-10-CM

## 2020-01-10 DIAGNOSIS — Z23 Encounter for immunization: Secondary | ICD-10-CM | POA: Diagnosis not present

## 2020-01-10 DIAGNOSIS — H029 Unspecified disorder of eyelid: Secondary | ICD-10-CM

## 2020-01-10 DIAGNOSIS — G8929 Other chronic pain: Secondary | ICD-10-CM

## 2020-01-10 DIAGNOSIS — I10 Essential (primary) hypertension: Secondary | ICD-10-CM | POA: Diagnosis not present

## 2020-01-10 LAB — CBC WITH DIFFERENTIAL/PLATELET
Basophils Absolute: 0.1 10*3/uL (ref 0.0–0.1)
Basophils Relative: 0.7 % (ref 0.0–3.0)
Eosinophils Absolute: 0.1 10*3/uL (ref 0.0–0.7)
Eosinophils Relative: 1.8 % (ref 0.0–5.0)
HCT: 45.4 % (ref 39.0–52.0)
Hemoglobin: 15.6 g/dL (ref 13.0–17.0)
Lymphocytes Relative: 28.2 % (ref 12.0–46.0)
Lymphs Abs: 2.2 10*3/uL (ref 0.7–4.0)
MCHC: 34.4 g/dL (ref 30.0–36.0)
MCV: 97.3 fl (ref 78.0–100.0)
Monocytes Absolute: 0.8 10*3/uL (ref 0.1–1.0)
Monocytes Relative: 9.8 % (ref 3.0–12.0)
Neutro Abs: 4.7 10*3/uL (ref 1.4–7.7)
Neutrophils Relative %: 59.5 % (ref 43.0–77.0)
Platelets: 223 10*3/uL (ref 150.0–400.0)
RBC: 4.67 Mil/uL (ref 4.22–5.81)
RDW: 13.1 % (ref 11.5–15.5)
WBC: 7.9 10*3/uL (ref 4.0–10.5)

## 2020-01-10 LAB — LIPID PANEL
Cholesterol: 166 mg/dL (ref 0–200)
HDL: 40.9 mg/dL (ref >39.00)
LDL Cholesterol: 110 mg/dL — ABNORMAL HIGH (ref 0–99)
NonHDL: 125.36
Total CHOL/HDL Ratio: 4
Triglycerides: 75 mg/dL (ref 0.0–149.0)
VLDL: 15 mg/dL (ref 0.0–40.0)

## 2020-01-10 LAB — COMPREHENSIVE METABOLIC PANEL
ALT: 22 U/L (ref 0–53)
AST: 22 U/L (ref 0–37)
Albumin: 4.2 g/dL (ref 3.5–5.2)
Alkaline Phosphatase: 71 U/L (ref 39–117)
BUN: 22 mg/dL (ref 6–23)
CO2: 26 mEq/L (ref 19–32)
Calcium: 8.9 mg/dL (ref 8.4–10.5)
Chloride: 106 mEq/L (ref 96–112)
Creatinine, Ser: 0.99 mg/dL (ref 0.40–1.50)
GFR: 75.93 mL/min (ref >60.00)
Glucose, Bld: 115 mg/dL — ABNORMAL HIGH (ref 70–99)
Potassium: 4.2 mEq/L (ref 3.5–5.1)
Sodium: 137 mEq/L (ref 135–145)
Total Bilirubin: 0.7 mg/dL (ref 0.2–1.2)
Total Protein: 6.8 g/dL (ref 6.0–8.3)

## 2020-01-10 LAB — HEMOGLOBIN A1C: Hgb A1c MFr Bld: 6 % (ref 4.6–6.5)

## 2020-01-10 LAB — TSH: TSH: 1.64 u[IU]/mL (ref 0.35–4.50)

## 2020-01-10 LAB — MICROALBUMIN / CREATININE URINE RATIO
Creatinine,U: 296.4 mg/dL
Microalb Creat Ratio: 0.3 mg/g (ref 0.0–30.0)
Microalb, Ur: 0.9 mg/dL (ref 0.0–1.9)

## 2020-01-10 LAB — PSA: PSA: 1.76 ng/mL (ref 0.10–4.00)

## 2020-01-10 NOTE — Progress Notes (Signed)
Pt had Tdap vaccine in the office today.  

## 2020-01-10 NOTE — Patient Instructions (Addendum)
For constipation: would try miralax to see if this helps regulate you.   Call your eye doctor ASAP. Need that taken off. If they can't get you in see who they recommend.  For your back.. try new chair at work, heating pad, stretches, voltaren gel. Make sure not associated with food. If not getting better we can xray and see if we need to send to PT. If associated with food, we need to ultrasound you.   tdap today. Good for 10 years on your tetanus.    Preventive Care 3-13 Years Old, Male Preventive care refers to lifestyle choices and visits with your health care provider that can promote health and wellness. This includes:  A yearly physical exam. This is also called an annual well check.  Regular dental and eye exams.  Immunizations.  Screening for certain conditions.  Healthy lifestyle choices, such as eating a healthy diet, getting regular exercise, not using drugs or products that contain nicotine and tobacco, and limiting alcohol use. What can I expect for my preventive care visit? Physical exam Your health care provider will check:  Height and weight. These may be used to calculate body mass index (BMI), which is a measurement that tells if you are at a healthy weight.  Heart rate and blood pressure.  Your skin for abnormal spots. Counseling Your health care provider may ask you questions about:  Alcohol, tobacco, and drug use.  Emotional well-being.  Home and relationship well-being.  Sexual activity.  Eating habits.  Work and work Statistician. What immunizations do I need?  Influenza (flu) vaccine  This is recommended every year. Tetanus, diphtheria, and pertussis (Tdap) vaccine  You may need a Td booster every 10 years. Varicella (chickenpox) vaccine  You may need this vaccine if you have not already been vaccinated. Zoster (shingles) vaccine  You may need this after age 1. Measles, mumps, and rubella (MMR) vaccine  You may need at least one dose  of MMR if you were born in 1957 or later. You may also need a second dose. Pneumococcal conjugate (PCV13) vaccine  You may need this if you have certain conditions and were not previously vaccinated. Pneumococcal polysaccharide (PPSV23) vaccine  You may need one or two doses if you smoke cigarettes or if you have certain conditions. Meningococcal conjugate (MenACWY) vaccine  You may need this if you have certain conditions. Hepatitis A vaccine  You may need this if you have certain conditions or if you travel or work in places where you may be exposed to hepatitis A. Hepatitis B vaccine  You may need this if you have certain conditions or if you travel or work in places where you may be exposed to hepatitis B. Haemophilus influenzae type b (Hib) vaccine  You may need this if you have certain risk factors. Human papillomavirus (HPV) vaccine  If recommended by your health care provider, you may need three doses over 6 months. You may receive vaccines as individual doses or as more than one vaccine together in one shot (combination vaccines). Talk with your health care provider about the risks and benefits of combination vaccines. What tests do I need? Blood tests  Lipid and cholesterol levels. These may be checked every 5 years, or more frequently if you are over 69 years old.  Hepatitis C test.  Hepatitis B test. Screening  Lung cancer screening. You may have this screening every year starting at age 55 if you have a 30-pack-year history of smoking and currently smoke  or have quit within the past 15 years.  Prostate cancer screening. Recommendations will vary depending on your family history and other risks.  Colorectal cancer screening. All adults should have this screening starting at age 48 and continuing until age 49. Your health care provider may recommend screening at age 58 if you are at increased risk. You will have tests every 1-10 years, depending on your results and the  type of screening test.  Diabetes screening. This is done by checking your blood sugar (glucose) after you have not eaten for a while (fasting). You may have this done every 1-3 years.  Sexually transmitted disease (STD) testing. Follow these instructions at home: Eating and drinking  Eat a diet that includes fresh fruits and vegetables, whole grains, lean protein, and low-fat dairy products.  Take vitamin and mineral supplements as recommended by your health care provider.  Do not drink alcohol if your health care provider tells you not to drink.  If you drink alcohol: ? Limit how much you have to 0-2 drinks a day. ? Be aware of how much alcohol is in your drink. In the [place] one drink equals one 12 oz bottle of beer (355 mL), one 5 oz glass of wine (148 mL), or one 1 oz glass of hard liquor (44 mL). Lifestyle  Take daily care of your teeth and gums.  Stay active. Exercise for at least 30 minutes on 5 or more days each week.  Do not use any products that contain nicotine or tobacco, such as cigarettes, e-cigarettes, and chewing tobacco. If you need help quitting, ask your health care provider.  If you are sexually active, practice safe sex. Use a condom or other form of protection to prevent STIs (sexually transmitted infections).  Talk with your health care provider about taking a low-dose aspirin every day starting at age 59. What's next?  Go to your health care provider once a year for a well check visit.  Ask your health care provider how often you should have your eyes and teeth checked.  Stay up to date on all vaccines. This information is not intended to replace advice given to you by your health care provider. Make sure you discuss any questions you have with your health care provider. Document Revised: 08/03/2018 Document Reviewed: 08/03/2018 Elsevier Patient Education  2020 [employer].

## 2020-01-10 NOTE — Progress Notes (Signed)
Patient: Louis Watson MRN: [id number] DOB: 1955-10-23 PCP: Orland Mustard, MD     Subjective:  Chief Complaint  Patient presents with  . Annual Exam  . Eyelid Lesion  . skin lesion on back  . Back Pain    HPI: The patient is a 64 y.o. male who presents today for annual exam. He denies any changes to past medical history. There have been no recent hospitalizations. They are not following a well balanced diet and exercise plan. Weight has been stable. He complains of growth on lower eyelid in right eye, lesion on back and back pain.   He has no first degree relative with colon, breast or prostate cancer.   He is due for labs for his hypertension, prediabetes and cholesterol. I saw him for this in February. He is fasting today.   Growth on right lower lid First noticed about 6 months ago and thought it was a skin tag. Has grown progressively over the last month and is starting to irritate his eye to open and close. No bleeding, not painful. No vision changes.   Bump on back First noticed last weekend. Has happened before and it was a tick. Doesn't hurt, so he just wants to make sure it's not a tick.   Right thoracic back pain He states he is fine until around 3:30-4pm his thoracic back will start to hurt. He wonders if it is his chair he is sitting in. Pain is stabbing in nature. He does take motrin that does help. Pain will radiate around to the lateral side and then to the front. He doesn't think it's associated with food. No nausea/vomiting. He will feel discomfort for a few hours and then is fine the next day until the afternoon. Some movement makes it worse. No tenderness to palpation.   Immunization History  Administered Date(s) Administered  . Hep A / Hep B 03/09/2018, 04/11/2018, 09/14/2018  . Hepatitis A 03/09/2018  . Hepatitis B 03/09/2018  . Influenza Whole 07/07/2018  . Influenza, Seasonal, Injecte, Preservative Fre 06/11/2015  . Influenza,inj,Quad PF,6+ Mos  07/07/2018, 05/24/2019  . Influenza,inj,Quad PF,6-35 Mos 05/28/2016  . Tdap 01/10/2020  . Typhoid Live 03/09/2018   Colonoscopy: 10/13/2016. Saw GI, may repeat this due to one episode of diverticulosis.  PSA: today  Review of Systems  Constitutional: Negative for chills, fatigue and fever.  HENT: Negative for dental problem, ear pain, hearing loss and trouble swallowing.   Eyes: Positive for redness. Negative for itching and visual disturbance.       Lesion lower right lid   Respiratory: Negative for cough, chest tightness and shortness of breath.   Cardiovascular: Negative for chest pain, palpitations and leg swelling.  Gastrointestinal: Negative for abdominal pain, blood in stool, diarrhea and nausea.  Endocrine: Negative for cold intolerance, polydipsia, polyphagia and polyuria.  Genitourinary: Negative for dysuria and hematuria.  Musculoskeletal: Positive for back pain. Negative for arthralgias.  Skin: Negative for rash.       Bump on back   Neurological: Negative for dizziness, light-headedness and headaches.  Psychiatric/Behavioral: Negative for dysphoric mood and sleep disturbance. The patient is not nervous/anxious.     Allergies Patient is allergic to nystatin and penicillins.  Past Medical History Patient  has a past medical history of Diverticulosis (01/17/2017), lumbar discectomy (01/17/2017), Hypertension, OSA on CPAP, Pancreatitis (2013), and Plantar fasciitis.  Surgical History Patient  has a past surgical history that includes Lumbar disc surgery (1993) and Knee arthroscopy (2013).  Family History Pateint's family history  includes Congestive Heart Failure in his mother.  Social History Patient  reports that he has been smoking cigars. He has never used smokeless tobacco. He reports that he does not drink alcohol or use drugs.    Objective: Vitals:   01/10/20 0821  BP: 110/72  Pulse: 61  Temp: (!) 97.2 F (36.2 C)  TempSrc: Temporal  SpO2: 95%  Weight:  276 lb 6.4 oz (125.4 kg)  Height: 5\' 10"  (1.778 m)    Body mass index is 39.66 kg/m.  Physical Exam Vitals reviewed.  Constitutional:      Appearance: Normal appearance. He is well-developed. He is obese.  HENT:     Head: Normocephalic and atraumatic.     Right Ear: Tympanic membrane, ear canal and external ear normal.     Left Ear: Tympanic membrane, ear canal and external ear normal.     Nose: Nose normal.     Mouth/Throat:     Mouth: Mucous membranes are moist.  Eyes:     Extraocular Movements: Extraocular movements intact.     Conjunctiva/sclera: Conjunctivae normal.     Pupils: Pupils are equal, round, and reactive to light.  Neck:     Thyroid: No thyromegaly.     Vascular: No carotid bruit.  Cardiovascular:     Rate and Rhythm: Normal rate and regular rhythm.     Pulses: Normal pulses.     Heart sounds: Normal heart sounds. No murmur.  Pulmonary:     Effort: Pulmonary effort is normal.     Breath sounds: Normal breath sounds.  Abdominal:     General: Bowel sounds are normal. There is no distension.     Palpations: Abdomen is soft.     Tenderness: There is no abdominal tenderness.     Comments: Negative murphys sign   Musculoskeletal:     Cervical back: Normal range of motion and neck supple.     Comments: NO TTP over right thoracic back where he has pain. Pain located along paraspinal area of T6 with radiation laterally to anterior aspect of t6.   Lymphadenopathy:     Cervical: No cervical adenopathy.  Skin:    General: Skin is warm and dry.     Capillary Refill: Capillary refill takes less than 2 seconds.     Findings: No rash.     Comments: SK on his back is area of concern   Right eyelid: lesion, midline of lid. Cauliflower type appearance.   Neurological:     General: No focal deficit present.     Mental Status: He is alert and oriented to person, place, and time.     Cranial Nerves: No cranial nerve deficit.     Coordination: Coordination normal.      Deep Tendon Reflexes: Reflexes normal.  Psychiatric:        Behavior: Behavior normal.       Office Visit from 01/10/2020 in [place]  PHQ-2 Total Score  0         Assessment/plan: 1. Annual physical exam Routine fasting labs today. Hm reviewed and tdap given. Has had covid vaccines, but does not have card. Otherwise UTD. Encouraged regular exercise, weight loss.  Patient counseling [x]    Nutrition: Stressed importance of moderation in sodium/caffeine intake, saturated fat and cholesterol, caloric balance, sufficient intake of fresh fruits, vegetables, fiber, calcium, iron, and 1 mg of folate supplement per day (for females capable of pregnancy).  [x]    Stressed the importance of regular exercise.   []   Substance Abuse: Discussed cessation/primary prevention of tobacco, alcohol, or other drug use; driving or other dangerous activities under the influence; availability of treatment for abuse.   [x]    Injury prevention: Discussed safety belts, safety helmets, smoke detector, smoking near bedding or upholstery.   [x]    Sexuality: Discussed sexually transmitted diseases, partner selection, use of condoms, avoidance of unintended pregnancy  and contraceptive alternatives.  [x]    Dental health: Discussed importance of regular tooth brushing, flossing, and dental visits.  [x]    Health maintenance and immunizations reviewed. Please refer to Health maintenance section.    - TSH  2. Essential hypertension Blood pressure is to goal. Continue current anti-hypertensive medications. Refills given and routine lab work will be done today. Recommended routine exercise and healthy diet including DASH diet and mediterranean diet. Encouraged weight loss. F/u in 6 months.   - CBC with Differential/Platelet - Comprehensive metabolic panel - Lipid panel - Microalbumin / creatinine urine ratio  3. Prediabetes  - Hemoglobin A1c  4. Need for Tdap vaccination  - Tdap vaccine  greater than or equal to 7yo IM  5. Prostate cancer screening  - PSA  6. Lesion of right lower eyelid Concern for possible cancer and that it's irritating his cornea. Advised he call his opthalmologist and try to get seen within next 2 weeks. If they can not see him instructed to ask him who he recommends, but he is an established patient and hopefully they can work him in. He is to let me know if he has any issues getting in within next 2 weeks. Do not want his cornea damaged and have concern for cancer of lesion.   7. Seborrheic keratosis Reassurance and discussed benign nature of these.   8. Chronic right-sided thoracic back pain ? If posture or chair related at work since happens at same time daily after sitting all day. No red flags on exam or history. Will take more conservative approach. Recommended new chair vs. Standing desks. Make sure supportive for back. Stretching, voltaren gel and heating pad. Offered muscle relaxer, but he doesn't feel like pain is that bad. Not related to food, but discussed have seen gallbladder present this way. He will make sure and observe if related to food. If worsening/not getting better discussed xray and PT.    This visit occurred during the SARS-CoV-2 public health emergency.  Safety protocols were in place, including screening questions prior to the visit, additional usage of staff PPE, and extensive cleaning of exam room while observing appropriate contact time as indicated for disinfecting solutions.     Return in about 6 months (around 07/12/2020) for htn/prediabetes .     , MD [place] [facility]  01/10/2020

## 2020-01-14 ENCOUNTER — Encounter: Payer: Self-pay | Admitting: Family Medicine

## 2020-01-15 DIAGNOSIS — G4733 Obstructive sleep apnea (adult) (pediatric): Secondary | ICD-10-CM | POA: Diagnosis not present

## 2020-01-25 DIAGNOSIS — D23112 Other benign neoplasm of skin of right lower eyelid, including canthus: Secondary | ICD-10-CM | POA: Diagnosis not present

## 2020-01-29 ENCOUNTER — Other Ambulatory Visit: Payer: Self-pay | Admitting: Family Medicine

## 2020-01-29 DIAGNOSIS — N529 Male erectile dysfunction, unspecified: Secondary | ICD-10-CM

## 2020-01-30 ENCOUNTER — Other Ambulatory Visit: Payer: Self-pay

## 2020-01-30 ENCOUNTER — Ambulatory Visit (INDEPENDENT_AMBULATORY_CARE_PROVIDER_SITE_OTHER)
Admission: RE | Admit: 2020-01-30 | Discharge: 2020-01-30 | Disposition: A | Discharge status: Home / Self Care | Payer: BC Managed Care – PPO | Source: Ambulatory Visit | Attending: Family Medicine | Admitting: Family Medicine

## 2020-01-30 ENCOUNTER — Encounter: Payer: Self-pay | Admitting: Family Medicine

## 2020-01-30 ENCOUNTER — Ambulatory Visit (INDEPENDENT_AMBULATORY_CARE_PROVIDER_SITE_OTHER): Payer: BC Managed Care – PPO | Admitting: Family Medicine

## 2020-01-30 VITALS — BP 140/80 | HR 63 | Temp 97.4°F | Ht 70.0 in | Wt 283.4 lb

## 2020-01-30 DIAGNOSIS — M546 Pain in thoracic spine: Secondary | ICD-10-CM

## 2020-01-30 DIAGNOSIS — G8929 Other chronic pain: Secondary | ICD-10-CM

## 2020-01-30 DIAGNOSIS — R0781 Pleurodynia: Secondary | ICD-10-CM

## 2020-01-30 DIAGNOSIS — M5134 Other intervertebral disc degeneration, thoracic region: Secondary | ICD-10-CM | POA: Diagnosis not present

## 2020-01-30 DIAGNOSIS — G4700 Insomnia, unspecified: Secondary | ICD-10-CM

## 2020-01-30 MED ORDER — BACLOFEN 10 MG PO TABS: 10.0000 mg | ORAL_TABLET | Freq: Three times a day (TID) | ORAL | 0 refills | Status: DC

## 2020-01-30 MED ORDER — ESZOPICLONE 2 MG PO TABS: 2.0000 mg | ORAL_TABLET | Freq: Every evening | ORAL | 0 refills | Status: DC | PRN

## 2020-01-30 NOTE — Patient Instructions (Addendum)
1) time to xray you!   2) muscle relaxer up to three times a day to see if this helps  3) try heating pad and stretches  4) if xrays normal, let's try PT> I put in referral but you can cancel if you are doing better.   Ill see what I can find out on your eye.   [facility] [address] in the basement. Across from [facility]

## 2020-01-30 NOTE — Progress Notes (Signed)
Patient: Louis Watson MRN: [id number] DOB: 1956-07-16 PCP: Orland Mustard, MD     Subjective:  Chief Complaint  Patient presents with  . Back Pain  . Chest Pain    HPI: The patient is a 64 y.o. male who presents today for back pain and abdominal pain located under the rib cage, right sided. He says that he mentioned this at his annual a month ago, but the back has not subsided. He is concerned about if the two are related or not.   He is starting to get pain around RUQ/under ribs. He doesn't think it's associated with food. He notices the pain around 3pm-5pm. He states at night after dinner he will feel uncomfortable. If he rolls over on his right side it will hurt. No nausea/vomiting. No abnormal BM. No blood in stool. Nothing really makes it better. Pain in RUQ is about a 4/10 and is mostly dull.   Right thoracic back pain from when I saw him for his annual on 5/20  He states he is fine until around 3:30-4pm his thoracic back will start to hurt. He wonders if it is his chair he is sitting in. Pain is stabbing in nature. He does take motrin that does help. Pain will radiate around to the lateral side and then to the front. He doesn't think it's associated with food. No nausea/vomiting. He will feel discomfort for a few hours and then is fine the next day until the afternoon. Some movement makes it worse. No tenderness to palpation. he changed his chair at work, but this doesn't really help. Motrin continues to take edge off. He is taking motrin TID.  No numbness/tingling/weakness. denies any rash on skin.   Needs refill of his lunesta.   Saw eye doctor who can not remove lesion until November. Would like to know if I know of anyone here who can see him sooner.   Review of Systems  Respiratory: Negative for cough, shortness of breath and wheezing.   Cardiovascular: Negative for chest pain and palpitations.  Gastrointestinal: Positive for abdominal pain. Negative for nausea and vomiting.   Musculoskeletal: Positive for back pain.  Neurological: Negative for dizziness, light-headedness and numbness.    Allergies Patient is allergic to nystatin and penicillins.  Past Medical History Patient  has a past medical history of Diverticulosis (01/17/2017), lumbar discectomy (01/17/2017), Hypertension, OSA on CPAP, Pancreatitis (2013), and Plantar fasciitis.  Surgical History Patient  has a past surgical history that includes Lumbar disc surgery (1993) and Knee arthroscopy (2013).  Family History Pateint's family history includes Congestive Heart Failure in his mother.  Social History Patient  reports that he has been smoking cigars. He has never used smokeless tobacco. He reports that he does not drink alcohol or use drugs.    Objective: Vitals:   01/30/20 1438  BP: 140/80  Pulse: 63  Temp: (!) 97.4 F (36.3 C)  TempSrc: Temporal  SpO2: 96%  Weight: 283 lb 6.4 oz (128.5 kg)  Height: 5\' 10"  (1.778 m)    Body mass index is 40.66 kg/m.  Physical Exam Vitals reviewed.  Constitutional:      Appearance: He is well-developed. He is obese.  HENT:     Head: Normocephalic and atraumatic.  Cardiovascular:     Rate and Rhythm: Normal rate and regular rhythm.     Heart sounds: Normal heart sounds.  Pulmonary:     Effort: Pulmonary effort is normal.     Breath sounds: Normal breath sounds.  Chest:  Chest wall: No tenderness.  Abdominal:     General: Bowel sounds are normal. There is no abdominal bruit.     Palpations: Abdomen is soft.     Comments: Negative murphys sign   Musculoskeletal:     Comments: TTP along right  rhomboids and paraspinal of T4. Normal shoulder ROM. TTP over lower right rib cage. No weakness in right arm.   Neurological:     General: No focal deficit present.     Mental Status: He is alert and oriented to person, place, and time.  Psychiatric:        Mood and Affect: Mood normal.        Assessment/plan: 1. Chronic right-sided thoracic  back pain Since persisting past a month xray ordered. Trial muscle relaxer and PT referral. No red flags at this time to warrant MRI, but if fails conservative therapy that will be next step.  - DG Thoracic Spine W/Swimmers; Future - Ambulatory referral to Physical Therapy  2. Rib pain on right side Xray/ PT/ muscle relaxer. Negative murphy's sign and all labs normal. Do not think gallbladder, but he is to let me know if pain starts to be related to food.  - DG Chest 2 View; Future - Ambulatory referral to Physical Therapy  3. Insomnia, unspecified type Medication refilled.  - eszopiclone (LUNESTA) 2 MG TABS tablet; Take 1 tablet (2 mg total) by mouth at bedtime as needed for sleep.  Dispense: 90 tablet; Refill: 0  -will look into someone in area to see if we can have removed sooner than November.   This visit occurred during the SARS-CoV-2 public health emergency.  Safety protocols were in place, including screening questions prior to the visit, additional usage of staff PPE, and extensive cleaning of exam room while observing appropriate contact time as indicated for disinfecting solutions.     Return if symptoms worsen or fail to improve.    Orma Flaming, MD [place]   01/30/2020

## 2020-01-31 ENCOUNTER — Other Ambulatory Visit: Payer: Self-pay | Admitting: Family Medicine

## 2020-01-31 DIAGNOSIS — H029 Unspecified disorder of eyelid: Secondary | ICD-10-CM

## 2020-02-13 ENCOUNTER — Ambulatory Visit (INDEPENDENT_AMBULATORY_CARE_PROVIDER_SITE_OTHER): Payer: BC Managed Care – PPO | Admitting: Physical Therapy

## 2020-02-13 ENCOUNTER — Encounter: Payer: Self-pay | Admitting: Physical Therapy

## 2020-02-13 ENCOUNTER — Other Ambulatory Visit: Payer: Self-pay

## 2020-02-13 DIAGNOSIS — M546 Pain in thoracic spine: Secondary | ICD-10-CM | POA: Diagnosis not present

## 2020-02-19 ENCOUNTER — Telehealth: Payer: Self-pay | Admitting: Family Medicine

## 2020-02-19 ENCOUNTER — Encounter: Payer: BC Managed Care – PPO | Admitting: Physical Therapy

## 2020-02-19 NOTE — Telephone Encounter (Signed)
Katelyn from [facility] office has requested medical records regarding why patient is being referred  Fax # [phone]

## 2020-02-20 NOTE — Telephone Encounter (Signed)
Faxed office notes, and received confirmation.

## 2020-02-20 NOTE — Telephone Encounter (Signed)
Notes printed and put on melitta's desk to fax.  Orland Mustard, MD [place] [facility]

## 2020-02-21 ENCOUNTER — Other Ambulatory Visit: Payer: Self-pay | Admitting: Family Medicine

## 2020-02-21 DIAGNOSIS — Z961 Presence of intraocular lens: Secondary | ICD-10-CM | POA: Diagnosis not present

## 2020-02-21 DIAGNOSIS — N529 Male erectile dysfunction, unspecified: Secondary | ICD-10-CM

## 2020-02-21 DIAGNOSIS — D485 Neoplasm of uncertain behavior of skin: Secondary | ICD-10-CM | POA: Diagnosis not present

## 2020-02-26 ENCOUNTER — Encounter: Payer: Self-pay | Admitting: Physical Therapy

## 2020-02-26 NOTE — Therapy (Signed)
[facility] [address] [place], [place], [address] Phone: [phone]   Fax:  [phone]  Physical Therapy Evaluation  Patient Details  Name: Louis Watson MRN: [id number] Date of Birth: Nov 07, 1955 Referring Provider (PT): Orland Mustard   Encounter Date: 02/13/2020   PT End of Session - 02/26/20 3903    Visit Number 1    Number of Visits 12    Date for PT Re-Evaluation 03/26/20    Authorization Type BCBS    PT Start Time 1600    PT Stop Time 1641    PT Time Calculation (min) 41 min    Activity Tolerance Patient tolerated treatment well    Behavior During Therapy [facility] for tasks assessed/performed           Past Medical History:  Diagnosis Date  . Diverticulosis 01/17/2017  . Hx of lumbar discectomy 01/17/2017  . Hypertension   . OSA on CPAP    setting = 8  . Pancreatitis 2013  . Plantar fasciitis     Past Surgical History:  Procedure Laterality Date  . KNEE ARTHROSCOPY  2013  . LUMBAR DISC SURGERY  1993    There were no vitals filed for this visit.    Subjective Assessment - 02/26/20 0818    Subjective Pt states new onset of pain after he was Cleaning backsplash about 6 weeks ago..  Mid thoracic pain wraps around to lateral torso on R  . Works mostly sitting computer, pharm co. Has not had same pain previously.    Limitations Sitting;House hold activities;Lifting;Standing    Patient Stated Goals Decreased pain    Currently in Pain? Yes    Pain Score 4     Pain Location Back    Pain Orientation Mid;Right    Pain Descriptors / Indicators Aching    Pain Type Acute pain    Pain Onset More than a month ago    Pain Frequency Intermittent    Aggravating Factors  increased activity, standing, work duties.              [facility] PT Assessment - 02/26/20 0001      Assessment   Medical Diagnosis Thoracic Pain/ R    Referring Provider (PT) Orland Mustard    Prior Therapy no      Balance Screen   Has the patient  fallen in the past 6 months No      Prior Function   Level of Independence Independent      Cognition   Overall Cognitive Status Within Functional Limits for tasks assessed      Posture/Postural Control   Posture Comments Poor seated posture, increased PPT and rounded shoulders.      ROM / Strength   AROM / PROM / Strength AROM;Strength      AROM   AROM Assessment Site Lumbar    Lumbar Flexion mild limitation    Lumbar Extension mild/mod limitation    Lumbar - Right Side Bend mod limitation/pain    Lumbar - Left Side Bend min limitation/pain      Strength   Overall Strength Comments hips: 4/5, Knee: 5/5, core: 3/5 : Scapular: 4/5       Palpation   Palpation comment Pain in central mid, thoracic region with PAs, pain into R lateral torso, and rib                       Objective measurements completed on examination: See above findings.  PT Education - 02/26/20 8676    Education Details PT POC, Exam findings,    Person(s) Educated Patient    Methods Explanation;Demonstration    Comprehension Verbalized understanding;Returned demonstration            PT Short Term Goals - 02/26/20 0827      PT SHORT TERM GOAL #1   Title Pt to be independent with initial HEP    Time 3    Period Weeks    Status New    Target Date 03/05/20             PT Long Term Goals - 02/26/20 0827      PT LONG TERM GOAL #1   Title Pt to be independent with final HEP    Time 6    Period Weeks    Status New    Target Date 03/26/20      PT LONG TERM GOAL #2   Title Pt to report decreased pain in R thoracic spine to 0-1/10 with activity    Time 6    Period Weeks    Status New    Target Date 03/26/20      PT LONG TERM GOAL #3   Title Pt to demo improved thoracic and lumbar ROM in all directions to be WNL and pain free, to improve ability for ADLS.    Time 6    Period Weeks    Status New    Target Date 03/26/20      PT LONG TERM GOAL #4    Title Pt to demo proper mechanics for sitting/desk work, as well as bend/lift/squat, to improve pain with daily activities.    Time 6    Period Weeks    Status New    Target Date 03/26/20                  Plan - 02/26/20 0835    Clinical Impression Statement Pt presents with primary complaint of increased pain in mid/low back on R. Pt with decreased lumbar ROM, with increased pain. He has decreased seated posture, and decreased posture for computer/work duties. Pt with lack of effective HEP for his dx and will benefit from education on this as well as posture. . He has decreased ability for full functional activities, due to pain in thoracic spine. Pt to benefit form skilled PT to improve deficits and pain.    Examination-Activity Limitations Lift;Locomotion Level;Bend;Reach Overhead;Carry;Sit;Squat;Sleep    Examination-Participation Restrictions Yard Work;Cleaning;Community Activity;Laundry;Shop;Meal Prep    Stability/Clinical Decision Making Stable/Uncomplicated    Clinical Decision Making Low    Rehab Potential Good    PT Frequency 2x / week    PT Duration 6 weeks    PT Treatment/Interventions ADLs/Self Care Home Management;Cryotherapy;Electrical Stimulation;Iontophoresis 4mg /ml Dexamethasone;Moist Heat;Traction;Therapeutic exercise;Therapeutic activities;Functional mobility training;DME Instruction;Ultrasound;Neuromuscular re-education;Patient/family education;Manual techniques;Passive range of motion;Dry needling;Taping;Spinal Manipulations;Joint Manipulations    Consulted and Agree with Plan of Care Patient           Patient will benefit from skilled therapeutic intervention in order to improve the following deficits and impairments:  Pain, Improper body mechanics, Increased muscle spasms, Decreased mobility, Decreased activity tolerance, Decreased strength, Decreased range of motion, Impaired flexibility  Visit Diagnosis: Pain in thoracic spine     Problem List Patient  Active Problem List   Diagnosis Date Noted  . Prediabetes 10/19/2019  . Fuchs' corneal dystrophy 10/19/2019  . Eczema 06/17/2017  . Partial tear of right subscapularis tendon 01/25/2017  . Hx of lumbar  discectomy 01/17/2017  . Diverticulosis 01/17/2017  . Erectile dysfunction 01/15/2017  . Chronic right shoulder pain 01/15/2017  . Hypertension 10/29/2014  . Insomnia 10/29/2014  . Sleep apnea 10/29/2014   Sedalia Muta, PT, DPT 8:44 AM  02/26/20    [facility] [address] [place], [place], [address] Phone: [phone]   Fax:  [phone]  Name: Louis Watson MRN: [id number] Date of Birth: 1956/05/26

## 2020-03-05 ENCOUNTER — Encounter: Payer: BC Managed Care – PPO | Admitting: Physical Therapy

## 2020-03-07 DIAGNOSIS — Z20822 Contact with and (suspected) exposure to covid-19: Secondary | ICD-10-CM | POA: Diagnosis not present

## 2020-03-07 DIAGNOSIS — J029 Acute pharyngitis, unspecified: Secondary | ICD-10-CM | POA: Diagnosis not present

## 2020-03-07 DIAGNOSIS — R05 Cough: Secondary | ICD-10-CM | POA: Diagnosis not present

## 2020-03-15 ENCOUNTER — Other Ambulatory Visit: Payer: Self-pay | Admitting: Family Medicine

## 2020-03-15 DIAGNOSIS — N529 Male erectile dysfunction, unspecified: Secondary | ICD-10-CM

## 2020-03-17 NOTE — Telephone Encounter (Signed)
Last refill: 02/21/20 #30, 1 Last OV: 01/30/20 dx. Back, chest pain

## 2020-03-20 ENCOUNTER — Other Ambulatory Visit: Payer: Self-pay

## 2020-03-20 ENCOUNTER — Ambulatory Visit (INDEPENDENT_AMBULATORY_CARE_PROVIDER_SITE_OTHER): Payer: BC Managed Care – PPO | Admitting: Physical Therapy

## 2020-03-20 ENCOUNTER — Encounter: Payer: Self-pay | Admitting: Physical Therapy

## 2020-03-20 DIAGNOSIS — M546 Pain in thoracic spine: Secondary | ICD-10-CM

## 2020-03-20 NOTE — Patient Instructions (Signed)
Access Code: [email or web address] URL: [email or web address] Date: 03/20/2020 Prepared by: Sedalia Muta  Exercises Standing Sidebending with Chair Support - 2 x daily - 3 reps - 30 hold Standing Row with Anchored Resistance - 1 x daily - 2 sets - 10 reps Standing Shoulder External Rotation with Resistance - 1 x daily - 2 sets - 10 reps Hooklying Shoulder I - 1 x daily - 2 sets - 10 reps Seated Thoracic Flexion and Rotation with Arms Crossed - 1 x daily - 1 sets - 10 reps - 5 hold Seated Thoracic Lumbar Extension - 1 x daily - 1 sets - 10 reps - 5 hold

## 2020-03-20 NOTE — Therapy (Signed)
Louis Watson [address] [place], [place], [address] Phone: [phone]   Fax:  [phone]  Physical Therapy Treatment/Discharge   Patient Details  Name: Louis Watson MRN: [id number] Date of Birth: [birth year] Referring Provider (PT): Orma Flaming   Encounter Date: 03/20/2020   PT End of Session - 03/20/20 1523    Visit Number 2    Number of Visits 12    Date for PT Re-Evaluation 03/26/20    Authorization Type BCBS    PT Start Time 1432    PT Stop Time 1510    PT Time Calculation (min) 38 min    Activity Tolerance Patient tolerated treatment well    Behavior During Therapy Louis Watson for tasks assessed/performed           Past Medical History:  Diagnosis Date  . Diverticulosis 01/17/2017  . Hx of lumbar discectomy 01/17/2017  . Hypertension   . OSA on CPAP    setting = 8  . Pancreatitis 2013  . Plantar fasciitis     Past Surgical History:  Procedure Laterality Date  . KNEE ARTHROSCOPY  2013  . [place] SURGERY  1993    There were no vitals filed for this visit.   Subjective Assessment - 03/20/20 1521    Subjective Pt last seen 6/23. He reports much improvement of thoracic and rib pain. Has slight soreness/pressure at times, in R lateral ribs/torso, but no thoracic back pain. Did have mild low back pain after visiting grandkids 2 weeks ago, but is now resolved.    Limitations Sitting;House hold activities;Lifting;Standing    Patient Stated Goals Decreased pain    Currently in Pain? No/denies    Pain Score 0-No pain    Pain Onset More than a month ago                             [facility] Adult PT Treatment/Exercise - 03/20/20 0001      Posture/Postural Control   Posture Comments Poor seated posture, increased PPT and rounded shoulders.      Exercises   Exercises Lumbar      Lumbar Exercises: Stretches   Active Hamstring Stretch 3 reps;30 seconds    Active Hamstring Stretch Limitations seated     Other Lumbar Stretch Exercise Standing QL stretch 30 sec x 4 bil;     Other Lumbar Stretch Exercise Seated thoracic rotation and ext x 10 each;       Lumbar Exercises: Standing   Row 20 reps    Theraband Level (Row) Level 3 (Green)    Other Standing Lumbar Exercises Bil shoulder ER GTB x 15;       Lumbar Exercises: Supine   Ab Set 15 reps    Other Supine Lumbar Exercises TA with Shoulder flexion x 15;       Manual Therapy   Manual Therapy Joint mobilization    Joint Mobilization thoracic PA mobs and R rib mobs (palpation for assessment of pain in ribs)                   PT Education - 03/20/20 1523    Education Details Final HEP reviewed, updated    Person(s) Educated Patient    Methods Explanation;Demonstration;Tactile cues;Handout;Verbal cues    Comprehension Verbalized understanding;Returned demonstration;Verbal cues required;Tactile cues required            PT Short Term Goals - 03/20/20 1524  PT SHORT TERM GOAL #1   Title Pt to be independent with initial HEP    Time 3    Period Weeks    Status Achieved    Target Date 03/05/20             PT Long Term Goals - 03/20/20 1524      PT LONG TERM GOAL #1   Title Pt to be independent with final HEP    Time 6    Period Weeks    Status Achieved      PT LONG TERM GOAL #2   Title Pt to report decreased pain in R thoracic spine to 0-1/10 with activity    Time 6    Period Weeks    Status Achieved      PT LONG TERM GOAL #3   Title Pt to demo improved thoracic and lumbar ROM in all directions to be WNL and pain free, to improve ability for ADLS.    Baseline mild limitation for lumbar flexion    Time 6    Period Weeks    Status Achieved      PT LONG TERM GOAL #4   Title Pt to demo proper mechanics for sitting/desk work, as well as bend/lift/squat, to improve pain with daily activities.    Time 6    Period Weeks    Status Achieved                 Plan - 03/20/20 1525    Clinical  Impression Statement Pt with no pain to palpate in thoracic spine or ribs today. Pt seems to be doing very well. Reviewed importance of posture for work. Reviewed final HEP for thoracic mobility, as well as strengthening and posture. Pt has met goals at this time and is ready for d/c to HEP. Pt in agreement with plan.    Examination-Activity Limitations Lift;Locomotion Level;Bend;Reach Overhead;Carry;Sit;Squat;Sleep    Examination-Participation Restrictions Yard Work;Cleaning;Community Activity;Laundry;Shop;Meal Prep    Stability/Clinical Decision Making Stable/Uncomplicated    Rehab Potential Good    PT Frequency 2x / week    PT Duration 6 weeks    PT Treatment/Interventions ADLs/Self Care Home Management;Cryotherapy;Electrical Stimulation;Iontophoresis 70m/ml Dexamethasone;Moist Heat;Traction;Therapeutic exercise;Therapeutic activities;Functional mobility training;DME Instruction;Ultrasound;Neuromuscular re-education;Patient/family education;Manual techniques;Passive range of motion;Dry needling;Taping;Spinal Manipulations;Joint Manipulations    Consulted and Agree with Plan of Care Patient           Patient will benefit from skilled therapeutic intervention in order to improve the following deficits and impairments:  Pain, Improper body mechanics, Increased muscle spasms, Decreased mobility, Decreased activity tolerance, Decreased strength, Decreased range of motion, Impaired flexibility  Visit Diagnosis: Pain in thoracic spine     Problem List Patient Active Problem List   Diagnosis Date Noted  . Prediabetes 10/19/2019  . Fuchs' corneal dystrophy 10/19/2019  . Eczema 06/17/2017  . Partial tear of right subscapularis tendon 01/25/2017  . Hx of lumbar discectomy 01/17/2017  . Diverticulosis 01/17/2017  . Erectile dysfunction 01/15/2017  . Chronic right shoulder pain 01/15/2017  . Hypertension 10/29/2014  . Insomnia 10/29/2014  . Sleep apnea 10/29/2014    Louis Watson PT,  DPT 3:27 PM  03/20/20    [facility] [address]Phone: [phone]  Fax:  [phone] Name: Louis RustMRN: [id number] of Birth: 817-Jan-1957   PHYSICAL THERAPY DISCHARGE SUMMARY  Visits from Start of Care: 2 Plan: Patient agrees to discharge.  Patient goals were met. Patient  is being discharged due to meeting the stated rehab goals.  ?????     Lyndee Watson, PT, DPT 3:27 PM  03/20/20

## 2020-03-27 ENCOUNTER — Encounter: Payer: Self-pay | Admitting: Family Medicine

## 2020-03-27 ENCOUNTER — Telehealth (INDEPENDENT_AMBULATORY_CARE_PROVIDER_SITE_OTHER): Payer: BC Managed Care – PPO | Admitting: Family Medicine

## 2020-03-27 DIAGNOSIS — D485 Neoplasm of uncertain behavior of skin: Secondary | ICD-10-CM | POA: Diagnosis not present

## 2020-03-27 DIAGNOSIS — Z961 Presence of intraocular lens: Secondary | ICD-10-CM | POA: Diagnosis not present

## 2020-03-27 DIAGNOSIS — Z8719 Personal history of other diseases of the digestive system: Secondary | ICD-10-CM

## 2020-03-27 DIAGNOSIS — R1032 Left lower quadrant pain: Secondary | ICD-10-CM

## 2020-03-27 MED ORDER — METRONIDAZOLE 500 MG PO TABS: 500.0000 mg | ORAL_TABLET | Freq: Three times a day (TID) | ORAL | 0 refills | Status: DC

## 2020-03-27 MED ORDER — CIPROFLOXACIN HCL 500 MG PO TABS: 500.0000 mg | ORAL_TABLET | Freq: Two times a day (BID) | ORAL | 0 refills | Status: DC

## 2020-03-27 NOTE — Progress Notes (Signed)
Virtual Visit via Telephone Note  I connected with Louis Watson on 03/27/20 at 11:20 AM EDT by telephone and verified that I am speaking with the correct person using two identifiers.   I discussed the limitations, risks, security and privacy concerns of performing an evaluation and management service by telephone and the availability of in person appointments. I also discussed with the patient that there may be a patient responsible charge related to this service. The patient expressed understanding and agreed to proceed.  Location patient: home, [place] Location provider: work or home office Participants present for the call: patient, provider Patient did not have a visit in the prior 7 days to address this/these issue(s).   History of Present Illness:  Acute visit for  - in 09/2019 had some nausea and abd pain for 1-2 weeks w/ constipation - at the time had a CT scan and was diagnosed with diverticulitis -reports at the time was told by PCP that if occurs again would treat with abx -for that last week has had some LLQ dull abd pain, mild nausea - feels the same as prior diverticulitis and wants to get abx -denies fevers, malaise, CP, resp symptoms, vomiting, diarrhea, sick exposures, melena, hematochezia -has penicillin allergy -fully vaccinated for COVID19 -normal BMs -had colonoscopy 3-4 years ago   Observations/Objective: Patient sounds cheerful and well on the phone. I do not appreciate any SOB. Speech and thought processing are grossly intact. Patient reported vitals:  Assessment and Plan:  LLQ abdominal pain  History of diverticulitis  -we discussed possible serious and likely etiologies, options for evaluation and workup, limitations of telemedicine visit vs in person visit, treatment, treatment risks and precautions. Pt prefers to treat via telemedicine empirically rather then risking or undertaking an in person visit at this moment. HE feels pretty confident that this is a  flare in diverticulitis and prefers to try clear liquids for 1-2 days, then advance along with Cipro/flagy. Discussed risks and precautions. Advised follow up with PCP in 1-2 week and to seek prompt in person care sooner if worsening, new symptoms arise, or if is not improving with treatment.  Follow Up Instructions:   I did not refer this patient for an OV in the next 24 hours for this/these issue(s).  I discussed the assessment and treatment plan with the patient. The patient was provided an opportunity to ask questions and all were answered. The patient agreed with the plan and demonstrated an understanding of the instructions.   The patient was advised to call back or seek an in-person evaluation if the symptoms worsen or if the condition fails to improve as anticipated.  I provided 22 minutes of non-face-to-face time during this encounter.   [name], DO

## 2020-03-27 NOTE — Patient Instructions (Signed)
-  I sent the medication(s) we discussed to your pharmacy: Meds ordered this encounter  Medications  . ciprofloxacin (CIPRO) 500 MG tablet    Sig: Take 1 tablet (500 mg total) by mouth 2 (two) times daily.    Dispense:  14 tablet    Refill:  0  . metroNIDAZOLE (FLAGYL) 500 MG tablet    Sig: Take 1 tablet (500 mg total) by mouth 3 (three) times daily.    Dispense:  21 tablet    Refill:  0    Please let us know if you have any questions or concerns regarding this prescription.  Follow up with your doctor in 1-2 weeks.  I hope you are feeling better soon! Seek care promptly if your symptoms worsen, new concerns arise or you are not improving with treatment.   Diverticulitis  Diverticulitis is when small pockets in your large intestine (colon) get infected or swollen. This causes stomach pain and watery poop (diarrhea). These pouches are called diverticula. They form in people who have a condition called diverticulosis. Follow these instructions at home: Medicines  Take over-the-counter and prescription medicines only as told by your doctor. These include: ? Antibiotics. ? Pain medicines. ? Fiber pills. ? Probiotics. ? Stool softeners.  Do not drive or use heavy machinery while taking prescription pain medicine.  If you were prescribed an antibiotic, take it as told. Do not stop taking it even if you feel better. General instructions   Follow a diet as told by your doctor.  When you feel better, your doctor may tell you to change your diet. You may need to eat a lot of fiber. Fiber makes it easier to poop (have bowel movements). Healthy foods with fiber include: ? Berries. ? Beans. ? Lentils. ? Green vegetables.  Exercise 3 or more times a week. Aim for 30 minutes each time. Exercise enough to sweat and make your heart beat faster.  Keep all follow-up visits as told. This is important. You may need to have an exam of the large intestine. This is called a  colonoscopy. Contact a doctor if:  Your pain does not get better.  You have a hard time eating or drinking.  You are not pooping like normal. Get help right away if:  Your pain gets worse.  Your problems do not get better.  Your problems get worse very fast.  You have a fever.  You throw up (vomit) more than one time.  You have poop that is: ? Bloody. ? Black. ? Tarry. Summary  Diverticulitis is when small pockets in your large intestine (colon) get infected or swollen.  Take medicines only as told by your doctor.  Follow a diet as told by your doctor. This information is not intended to replace advice given to you by your health care provider. Make sure you discuss any questions you have with your health care provider. Document Revised: 07/22/2017 Document Reviewed: 08/26/2016 Elsevier Patient Education  2020 [employer].

## 2020-04-07 ENCOUNTER — Encounter: Payer: Self-pay | Admitting: Family Medicine

## 2020-04-07 ENCOUNTER — Other Ambulatory Visit: Payer: Self-pay

## 2020-04-07 ENCOUNTER — Ambulatory Visit (INDEPENDENT_AMBULATORY_CARE_PROVIDER_SITE_OTHER): Payer: BC Managed Care – PPO | Admitting: Family Medicine

## 2020-04-07 VITALS — BP 118/78 | HR 63 | Temp 97.7°F | Ht 70.0 in | Wt 272.0 lb

## 2020-04-07 DIAGNOSIS — K5900 Constipation, unspecified: Secondary | ICD-10-CM

## 2020-04-07 DIAGNOSIS — R109 Unspecified abdominal pain: Secondary | ICD-10-CM

## 2020-04-07 LAB — COMPREHENSIVE METABOLIC PANEL
AG Ratio: 1.5 (calc) (ref 1.0–2.5)
ALT: 25 U/L (ref 9–46)
AST: 22 U/L (ref 10–35)
Albumin: 4.1 g/dL (ref 3.6–5.1)
Alkaline phosphatase (APISO): 68 U/L (ref 35–144)
BUN/Creatinine Ratio: 16 (calc) (ref 6–22)
BUN: 21 mg/dL (ref 7–25)
CO2: 31 mmol/L (ref 20–32)
Calcium: 9.4 mg/dL (ref 8.6–10.3)
Chloride: 104 mmol/L (ref 98–110)
Creat: 1.29 mg/dL — ABNORMAL HIGH (ref 0.70–1.25)
Globulin: 2.7 g/dL (calc) (ref 1.9–3.7)
Glucose, Bld: 129 mg/dL — ABNORMAL HIGH (ref 65–99)
Potassium: 4.3 mmol/L (ref 3.5–5.3)
Sodium: 139 mmol/L (ref 135–146)
Total Bilirubin: 0.7 mg/dL (ref 0.2–1.2)
Total Protein: 6.8 g/dL (ref 6.1–8.1)

## 2020-04-07 LAB — CBC WITH DIFFERENTIAL/PLATELET
Absolute Monocytes: 669 cells/uL (ref 200–950)
Basophils Absolute: 38 cells/uL (ref 0–200)
Basophils Relative: 0.5 %
Eosinophils Absolute: 160 cells/uL (ref 15–500)
Eosinophils Relative: 2.1 %
HCT: 47 % (ref 38.5–50.0)
Hemoglobin: 16.1 g/dL (ref 13.2–17.1)
Lymphs Abs: 2174 cells/uL (ref 850–3900)
MCH: 33.3 pg — ABNORMAL HIGH (ref 27.0–33.0)
MCHC: 34.3 g/dL (ref 32.0–36.0)
MCV: 97.3 fL (ref 80.0–100.0)
MPV: 10 fL (ref 7.5–12.5)
Monocytes Relative: 8.8 %
Neutro Abs: 4560 cells/uL (ref 1500–7800)
Neutrophils Relative %: 60 %
Platelets: 229 10*3/uL (ref 140–400)
RBC: 4.83 10*6/uL (ref 4.20–5.80)
RDW: 12.1 % (ref 11.0–15.0)
Total Lymphocyte: 28.6 %
WBC: 7.6 10*3/uL (ref 3.8–10.8)

## 2020-04-07 NOTE — Patient Instructions (Addendum)
-  I want you to go get citrucel. It's a fiber bulking agent and if you can't find this get metamucil.  -start moving more!!! -keep up water intake.  -checking labs so we can more confidently say likely not diveriticulitis - I do not think you have this, possibly more constipation.  -call me if pain comes back.   -if you have a noticeable change in BM I would get colonoscopy. You saw Dr. Marina Goodell at [facility] for this. Would be worth seeing him or setting up your procedure.

## 2020-04-07 NOTE — Progress Notes (Signed)
Patient: Louis Watson MRN: [id number] DOB: Sep 04, 1955 PCP: Orland Mustard, MD     Subjective:  Chief Complaint  Patient presents with  . Abdominal Pain    HPI: The patient is a 64 y.o. male who presents today for diverticulitis. He saw Dr. Selena Batten for a virtual visit on 03/27/20. He states symptoms started about 3 weeks ago with nausea and constipation. He states symptoms improved, but he was concerned so he made appointment.  He just didn't feel good.  He was given cipro/flagyl but states he had a reaction to these and stopped these. He modified diet and has felt fine since that time. He states nausea has resolved. Still using miralax for constipation. He had loose stools next week and didn't take miralax Friday and had no BM. Saturday he felt constipated and felt like he needed to go to the bathroom, but nothing came out. He has taken miralax and had some small BM. At this time he is having BM daily on average. He was taking miralax before he got sick and had symptoms. He is drinking water, but has limited physical activity.   Of note before symptoms started he had a viral illness with mild fever/stomach pain.   Review of Systems  Constitutional: Negative for chills and fever.  Respiratory: Negative for cough and shortness of breath.   Cardiovascular: Negative for chest pain and palpitations.  Gastrointestinal: Positive for abdominal pain. Negative for nausea and vomiting.  Neurological: Negative for dizziness and headaches.    Allergies Patient is allergic to nystatin and penicillins.  Past Medical History Patient  has a past medical history of Diverticulosis (01/17/2017), lumbar discectomy (01/17/2017), Hypertension, OSA on CPAP, Pancreatitis (2013), and Plantar fasciitis.  Surgical History Patient  has a past surgical history that includes Lumbar disc surgery (1993) and Knee arthroscopy (2013).  Family History Pateint's family history includes Congestive Heart Failure in his  mother.  Social History Patient  reports that he has been smoking cigars. He has never used smokeless tobacco. He reports that he does not drink alcohol and does not use drugs.    Objective: Vitals:   04/07/20 0759  BP: 118/78  Pulse: 63  Temp: 97.7 F (36.5 C)  TempSrc: Temporal  SpO2: 95%  Weight: 272 lb (123.4 kg)  Height: 5\' 10"  (1.778 m)    Body mass index is 39.03 kg/m.  Physical Exam Vitals reviewed.  Constitutional:      Appearance: He is well-developed. He is obese.  HENT:     Head: Normocephalic and atraumatic.  Cardiovascular:     Rate and Rhythm: Normal rate and regular rhythm.     Heart sounds: Normal heart sounds.  Pulmonary:     Effort: Pulmonary effort is normal.     Breath sounds: Normal breath sounds.  Abdominal:     General: Abdomen is protuberant. Bowel sounds are normal. There is no distension.     Palpations: Abdomen is soft.     Tenderness: There is no abdominal tenderness.  Skin:    General: Skin is warm.  Neurological:     General: No focal deficit present.     Mental Status: He is alert.  Psychiatric:        Mood and Affect: Mood normal.        Behavior: Behavior normal.        Assessment/plan: 1. Stomach pain Pain seems to be resolved. Do not think he had diverticulitis. Will check labs today and will feel more convinced if white count  normal, especially since pain resolved. Wonder if more constipation. Looked in past note and he has tolerated cipro and flagyl fine in past, so unsure if allergy to medication. Will hold on antibiotics right now until labs back since pain resolved.  - CBC with Differential/Platelet; Future - Comprehensive metabolic panel; Future  2. Constipation, unspecified constipation type Recommended citrucel and f/u with GI as he has colonoscopy to be scheduled and recommend he get this done since he has had change in bowel habits.     This visit occurred during the SARS-CoV-2 public health emergency.  Safety  protocols were in place, including screening questions prior to the visit, additional usage of staff PPE, and extensive cleaning of exam room while observing appropriate contact time as indicated for disinfecting solutions.     Return if symptoms worsen or fail to improve.   Orland Mustard, MD [facility]    04/07/2020

## 2020-04-09 ENCOUNTER — Other Ambulatory Visit: Payer: Self-pay | Admitting: Family Medicine

## 2020-04-09 DIAGNOSIS — N529 Male erectile dysfunction, unspecified: Secondary | ICD-10-CM

## 2020-04-15 DIAGNOSIS — G4733 Obstructive sleep apnea (adult) (pediatric): Secondary | ICD-10-CM | POA: Diagnosis not present

## 2020-05-02 ENCOUNTER — Telehealth: Payer: Self-pay

## 2020-05-02 DIAGNOSIS — Z9842 Cataract extraction status, left eye: Secondary | ICD-10-CM | POA: Diagnosis not present

## 2020-05-02 DIAGNOSIS — H18519 Endothelial corneal dystrophy, unspecified eye: Secondary | ICD-10-CM | POA: Diagnosis not present

## 2020-05-02 DIAGNOSIS — G4700 Insomnia, unspecified: Secondary | ICD-10-CM

## 2020-05-02 DIAGNOSIS — Z947 Corneal transplant status: Secondary | ICD-10-CM | POA: Diagnosis not present

## 2020-05-02 DIAGNOSIS — H2511 Age-related nuclear cataract, right eye: Secondary | ICD-10-CM | POA: Diagnosis not present

## 2020-05-02 MED ORDER — ESZOPICLONE 2 MG PO TABS: 2.0000 mg | ORAL_TABLET | Freq: Every evening | ORAL | 0 refills | Status: DC | PRN

## 2020-05-02 NOTE — Telephone Encounter (Signed)
..   LAST APPOINTMENT DATE: 04/09/2020   NEXT APPOINTMENT DATE:@Visit  date not found  MEDICATION:eszopiclone (LUNESTA) 2 MG TABS tablet    PHARMACY:[id number] - [place], [place] - [address]  Pt has 2 tabs left

## 2020-05-05 ENCOUNTER — Other Ambulatory Visit: Payer: Self-pay | Admitting: Family Medicine

## 2020-05-05 DIAGNOSIS — N529 Male erectile dysfunction, unspecified: Secondary | ICD-10-CM

## 2020-05-05 NOTE — Telephone Encounter (Signed)
Denied; Recently sent to pharmacy 05/02/2020. Pt needs to follow up with [facility].

## 2020-05-28 ENCOUNTER — Other Ambulatory Visit: Payer: Self-pay | Admitting: Family Medicine

## 2020-05-29 ENCOUNTER — Telehealth: Payer: Self-pay

## 2020-05-29 MED ORDER — AMLODIPINE BESYLATE 5 MG PO TABS
5.0000 mg | ORAL_TABLET | Freq: Every day | ORAL | 3 refills | Status: DC
Start: 2020-05-29 — End: 2021-05-11

## 2020-05-29 NOTE — Telephone Encounter (Signed)
Refilled.  Zyliah Schier, MD [facility]   

## 2020-05-29 NOTE — Telephone Encounter (Signed)
.. °  LAST APPOINTMENT DATE: 05/28/2020   NEXT APPOINTMENT DATE:@Visit  date not found  MEDICATION:amLODipine (NORVASC) 5 MG tablet    PHARMACY:[id number] - [place], [place] - [address] [place]

## 2020-06-03 ENCOUNTER — Other Ambulatory Visit: Payer: Self-pay | Admitting: Family Medicine

## 2020-06-03 DIAGNOSIS — N529 Male erectile dysfunction, unspecified: Secondary | ICD-10-CM

## 2020-06-04 ENCOUNTER — Other Ambulatory Visit: Payer: Self-pay | Admitting: Family Medicine

## 2020-06-04 DIAGNOSIS — N529 Male erectile dysfunction, unspecified: Secondary | ICD-10-CM

## 2020-07-14 DIAGNOSIS — G4733 Obstructive sleep apnea (adult) (pediatric): Secondary | ICD-10-CM | POA: Diagnosis not present

## 2020-07-31 ENCOUNTER — Telehealth: Payer: Self-pay

## 2020-07-31 DIAGNOSIS — G4700 Insomnia, unspecified: Secondary | ICD-10-CM

## 2020-07-31 MED ORDER — ESZOPICLONE 2 MG PO TABS: 2.0000 mg | ORAL_TABLET | Freq: Every evening | ORAL | 0 refills | Status: AC | PRN

## 2020-07-31 NOTE — Telephone Encounter (Signed)
..   LAST APPOINTMENT DATE: 06/04/2020   NEXT APPOINTMENT DATE:@12 /20/2021  MEDICATION:eszopiclone (LUNESTA) 2 MG TABS tablet   Pt took last tablet last night

## 2020-08-11 ENCOUNTER — Encounter: Payer: Self-pay | Admitting: Family Medicine

## 2020-08-11 ENCOUNTER — Other Ambulatory Visit: Payer: Self-pay

## 2020-08-11 ENCOUNTER — Ambulatory Visit (INDEPENDENT_AMBULATORY_CARE_PROVIDER_SITE_OTHER): Payer: BC Managed Care – PPO | Admitting: Family Medicine

## 2020-08-11 VITALS — BP 115/75 | HR 64 | Temp 97.3°F | Ht 70.0 in | Wt 271.2 lb

## 2020-08-11 DIAGNOSIS — I10 Essential (primary) hypertension: Secondary | ICD-10-CM

## 2020-08-11 DIAGNOSIS — R7303 Prediabetes: Secondary | ICD-10-CM

## 2020-08-11 DIAGNOSIS — G4709 Other insomnia: Secondary | ICD-10-CM | POA: Diagnosis not present

## 2020-08-11 MED ORDER — BETAMETHASONE DIPROPIONATE 0.05 % EX CREA: TOPICAL_CREAM | Freq: Two times a day (BID) | CUTANEOUS | 0 refills | Status: AC

## 2020-08-11 NOTE — Progress Notes (Signed)
Patient: Louis Watson MRN: [id number] DOB: 03-18-1956 PCP: Orland Mustard, MD     Subjective:  Chief Complaint  Patient presents with  . Medication Refill    Pt will be moving, and would like med refills.   . Hypertension  . Prediabetes    HPI: The patient is a 64 y.o. male who presents today for med refills before moving.   Hypertension: Here for follow up of hypertension.  Currently on novasc 5mg /day. Takes medication as prescribed and denies any side effects. Exercise includes none. Weight has been stable. Denies any chest pain, headaches, shortness of breath, vision changes, swelling in lower extremities. He has lost 12 pounds since I saw him in June.   Prediabetes Last a1c was 6.0. he is moving soon. Will do labs today. Has lost 12 pounds.   Insomnia He is on lunesta. No refills needed. Takes as prescribed.   He needs a refill of a cream for his eczema, but unsure which one it is.   Just retired and is moving back home to [place] at beginning of year.   Review of Systems  Constitutional: Negative for chills, fatigue and fever.  HENT: Negative for dental problem, ear pain, hearing loss and trouble swallowing.   Eyes: Negative for visual disturbance.  Respiratory: Negative for cough, chest tightness and shortness of breath.   Cardiovascular: Negative for chest pain, palpitations and leg swelling.  Gastrointestinal: Negative for abdominal pain, blood in stool, diarrhea and nausea.  Endocrine: Negative for cold intolerance, polydipsia, polyphagia and polyuria.  Genitourinary: Negative for dysuria and hematuria.  Musculoskeletal: Negative for arthralgias.  Skin: Negative for rash.  Neurological: Negative for dizziness and headaches.  Psychiatric/Behavioral: Negative for dysphoric mood and sleep disturbance. The patient is not nervous/anxious.     Allergies Patient is allergic to nystatin and penicillins.  Past Medical History Patient  has a past medical history  of Diverticulosis (01/17/2017), lumbar discectomy (01/17/2017), Hypertension, OSA on CPAP, Pancreatitis (2013), and Plantar fasciitis.  Surgical History Patient  has a past surgical history that includes Lumbar disc surgery (1993) and Knee arthroscopy (2013).  Family History Pateint's family history includes Congestive Heart Failure in his mother.  Social History Patient  reports that he has been smoking cigars. He has never used smokeless tobacco. He reports that he does not drink alcohol and does not use drugs.    Objective: Vitals:   08/11/20 0935  BP: 115/75  Pulse: 64  Temp: (!) 97.3 F (36.3 C)  TempSrc: Temporal  SpO2: 98%  Weight: 271 lb 3.2 oz (123 kg)  Height: 5\' 10"  (1.778 m)    Body mass index is 38.91 kg/m.  Physical Exam Vitals reviewed.  Constitutional:      Appearance: Normal appearance. He is well-developed and well-nourished. He is obese.  HENT:     Head: Normocephalic and atraumatic.     Right Ear: External ear normal.     Left Ear: External ear normal.     Mouth/Throat:     Mouth: Oropharynx is clear and moist.  Eyes:     Extraocular Movements: EOM normal.     Conjunctiva/sclera: Conjunctivae normal.     Pupils: Pupils are equal, round, and reactive to light.  Neck:     Thyroid: No thyromegaly.     Vascular: No carotid bruit.  Cardiovascular:     Rate and Rhythm: Normal rate and regular rhythm.     Pulses: Intact distal pulses.     Heart sounds: Normal heart sounds. No murmur  heard.   Pulmonary:     Effort: Pulmonary effort is normal.     Breath sounds: Normal breath sounds.  Abdominal:     General: Bowel sounds are normal. There is no distension.     Palpations: Abdomen is soft.     Tenderness: There is no abdominal tenderness.  Musculoskeletal:     Cervical back: Normal range of motion and neck supple.  Lymphadenopathy:     Cervical: No cervical adenopathy.  Skin:    General: Skin is warm and dry.     Capillary Refill: Capillary  refill takes less than 2 seconds.     Findings: No rash.  Neurological:     General: No focal deficit present.     Mental Status: He is alert and oriented to person, place, and time.     Cranial Nerves: No cranial nerve deficit.     Coordination: Coordination normal.     Deep Tendon Reflexes: Reflexes normal.  Psychiatric:        Mood and Affect: Mood and affect and mood normal.        Behavior: Behavior normal.    Flowsheet Row Office Visit from 08/11/2020 in [facility]  PHQ-2 Total Score 0         Assessment/plan: 1. Primary hypertension Blood pressure is to goal. Continue current anti-hypertensive medications-novasc 5mg /day. Refills not given and routine lab work will be done today. Recommended routine exercise and healthy diet including DASH diet and mediterranean diet. Encouraged weight loss. F/u in 6 months. Will be at new provider back home in .   - CBC with Differential/Platelet; Future - COMPLETE METABOLIC PANEL WITH GFR; Future - COMPLETE METABOLIC PANEL WITH GFR - CBC with Differential/Platelet  2. Prediabetes Has been to goal and has lost 12 pounds. Do not doubt this will be to goal and steady or improved as it has been. He is now retired and committed to working out and really working on [place] loss. F/u in 6-12 months with new provider.  - Hemoglobin A1c; Future - Hemoglobin A1c  3. Other insomnia Well controlled on lunesta. I just refilled this for him. Discussed I can not send over state [place] and he will have to get future refills at new provider in [place].   -needs refill of his cream for eczema, but only have a ointment in chart. Will refill for him after he emails me exact medication he needs refilled.   -will establish care with new provider up in [place]     This visit occurred during the SARS-CoV-2 public health emergency.  Safety protocols were in place, including screening questions prior to the visit, additional usage of staff PPE,  and extensive cleaning of exam room while observing appropriate contact time as indicated for disinfecting solutions.     Return if symptoms worsen or fail to improve.    8-12, MD [facility]   08/11/2020

## 2020-08-11 NOTE — Patient Instructions (Addendum)
-  Need your pneumonia shot!!! Need pneumovax 23. Can get at pharmacy.   -email me or call so I can send in right cream for you.   Everything else you look great on! im going to miss yall!   Merry christmas! Dr. Artis Flock

## 2020-08-12 LAB — CBC WITH DIFFERENTIAL/PLATELET
Absolute Monocytes: 598 cells/uL (ref 200–950)
Basophils Absolute: 50 cells/uL (ref 0–200)
Basophils Relative: 0.6 %
Eosinophils Absolute: 199 cells/uL (ref 15–500)
Eosinophils Relative: 2.4 %
HCT: 45.8 % (ref 38.5–50.0)
Hemoglobin: 15.6 g/dL (ref 13.2–17.1)
Lymphs Abs: 2332 cells/uL (ref 850–3900)
MCH: 32.8 pg (ref 27.0–33.0)
MCHC: 34.1 g/dL (ref 32.0–36.0)
MCV: 96.4 fL (ref 80.0–100.0)
MPV: 10 fL (ref 7.5–12.5)
Monocytes Relative: 7.2 %
Neutro Abs: 5121 cells/uL (ref 1500–7800)
Neutrophils Relative %: 61.7 %
Platelets: 244 10*3/uL (ref 140–400)
RBC: 4.75 10*6/uL (ref 4.20–5.80)
RDW: 12.1 % (ref 11.0–15.0)
Total Lymphocyte: 28.1 %
WBC: 8.3 10*3/uL (ref 3.8–10.8)

## 2020-08-12 LAB — HEMOGLOBIN A1C
Hgb A1c MFr Bld: 6.2 % of total Hgb — ABNORMAL HIGH (ref <5.7)
Mean Plasma Glucose: 131 mg/dL
eAG (mmol/L): 7.3 mmol/L

## 2020-08-12 LAB — COMPLETE METABOLIC PANEL WITH GFR
AG Ratio: 1.5 (calc) (ref 1.0–2.5)
ALT: 18 U/L (ref 9–46)
AST: 18 U/L (ref 10–35)
Albumin: 4 g/dL (ref 3.6–5.1)
Alkaline phosphatase (APISO): 67 U/L (ref 35–144)
BUN: 20 mg/dL (ref 7–25)
CO2: 27 mmol/L (ref 20–32)
Calcium: 9.3 mg/dL (ref 8.6–10.3)
Chloride: 104 mmol/L (ref 98–110)
Creat: 1.11 mg/dL (ref 0.70–1.25)
GFR, Est African American: 80 mL/min/{1.73_m2} (ref >=60)
GFR, Est Non African American: 69 mL/min/{1.73_m2} (ref >=60)
Globulin: 2.7 g/dL (calc) (ref 1.9–3.7)
Glucose, Bld: 153 mg/dL — ABNORMAL HIGH (ref 65–99)
Potassium: 4.5 mmol/L (ref 3.5–5.3)
Sodium: 139 mmol/L (ref 135–146)
Total Bilirubin: 0.7 mg/dL (ref 0.2–1.2)
Total Protein: 6.7 g/dL (ref 6.1–8.1)

## 2021-05-09 ENCOUNTER — Other Ambulatory Visit: Payer: Self-pay | Admitting: Family Medicine

## 2021-05-11 ENCOUNTER — Other Ambulatory Visit: Payer: Self-pay

## 2021-07-22 ENCOUNTER — Other Ambulatory Visit: Payer: Self-pay | Admitting: Family Medicine

## 2021-08-09 ENCOUNTER — Other Ambulatory Visit: Payer: Self-pay | Admitting: Family Medicine

## 2021-10-22 ENCOUNTER — Other Ambulatory Visit: Payer: Self-pay | Admitting: Family Medicine

## 2022-01-20 ENCOUNTER — Other Ambulatory Visit: Payer: Self-pay | Admitting: Family Medicine

## 2024-06-23 DEATH — deceased
